# Patient Record
Sex: Female | Born: 1980
Health system: Southern US, Community
[De-identification: ages and names within clinical notes are randomized; demographics above are authoritative.]

## PROBLEM LIST (undated history)

## (undated) DIAGNOSIS — Z8041 Family history of malignant neoplasm of ovary: Secondary | ICD-10-CM

## (undated) DIAGNOSIS — Z23 Encounter for immunization: Secondary | ICD-10-CM

## (undated) DIAGNOSIS — Z1371 Encounter for nonprocreative screening for genetic disease carrier status: Secondary | ICD-10-CM

## (undated) DIAGNOSIS — F32A Depression, unspecified: Secondary | ICD-10-CM

## (undated) DIAGNOSIS — N200 Calculus of kidney: Secondary | ICD-10-CM

## (undated) DIAGNOSIS — F320 Major depressive disorder, single episode, mild: Secondary | ICD-10-CM

## (undated) HISTORY — DX: Encounter for immunization: Z23

## (undated) HISTORY — PX: NO PAST SURGERIES: SHX2092

## (undated) HISTORY — DX: Depression, unspecified: F32.A

## (undated) HISTORY — DX: Family history of malignant neoplasm of ovary: Z80.41

## (undated) HISTORY — DX: Encounter for nonprocreative screening for genetic disease carrier status: Z13.71

## (undated) HISTORY — DX: Calculus of kidney: N20.0

## (undated) HISTORY — DX: Major depressive disorder, single episode, mild: F32.0

---

## 2004-04-01 ENCOUNTER — Emergency Department: Payer: Self-pay | Admitting: Emergency Medicine

## 2007-05-11 ENCOUNTER — Inpatient Hospital Stay: Payer: Self-pay

## 2010-03-17 ENCOUNTER — Observation Stay: Payer: Self-pay

## 2010-03-25 ENCOUNTER — Inpatient Hospital Stay: Payer: Self-pay | Admitting: Obstetrics & Gynecology

## 2015-07-22 DIAGNOSIS — Z803 Family history of malignant neoplasm of breast: Secondary | ICD-10-CM

## 2015-07-22 HISTORY — DX: Family history of malignant neoplasm of breast: Z80.3

## 2015-12-21 ENCOUNTER — Emergency Department (HOSPITAL_COMMUNITY)
Admission: EM | Admit: 2015-12-21 | Discharge: 2015-12-21 | Disposition: A | Discharge status: Home / Self Care | Payer: 59 | Attending: Emergency Medicine | Admitting: Emergency Medicine

## 2015-12-21 ENCOUNTER — Encounter (HOSPITAL_COMMUNITY): Payer: Self-pay

## 2015-12-21 DIAGNOSIS — T23051A Burn of unspecified degree of right palm, initial encounter: Secondary | ICD-10-CM | POA: Insufficient documentation

## 2015-12-21 DIAGNOSIS — Y999 Unspecified external cause status: Secondary | ICD-10-CM | POA: Insufficient documentation

## 2015-12-21 DIAGNOSIS — Y929 Unspecified place or not applicable: Secondary | ICD-10-CM | POA: Insufficient documentation

## 2015-12-21 DIAGNOSIS — T25222A Burn of second degree of left foot, initial encounter: Secondary | ICD-10-CM

## 2015-12-21 DIAGNOSIS — T24202A Burn of second degree of unspecified site of left lower limb, except ankle and foot, initial encounter: Secondary | ICD-10-CM | POA: Diagnosis not present

## 2015-12-21 DIAGNOSIS — Z23 Encounter for immunization: Secondary | ICD-10-CM | POA: Insufficient documentation

## 2015-12-21 DIAGNOSIS — T24001A Burn of unspecified degree of unspecified site of right lower limb, except ankle and foot, initial encounter: Secondary | ICD-10-CM | POA: Diagnosis present

## 2015-12-21 DIAGNOSIS — X088XXA Exposure to other specified smoke, fire and flames, initial encounter: Secondary | ICD-10-CM | POA: Diagnosis not present

## 2015-12-21 DIAGNOSIS — T24201A Burn of second degree of unspecified site of right lower limb, except ankle and foot, initial encounter: Secondary | ICD-10-CM

## 2015-12-21 DIAGNOSIS — Y9339 Activity, other involving climbing, rappelling and jumping off: Secondary | ICD-10-CM | POA: Insufficient documentation

## 2015-12-21 MED ORDER — KETOROLAC TROMETHAMINE 30 MG/ML IJ SOLN
30.0000 mg | Freq: Once | INTRAMUSCULAR | Status: AC
  Administered 2015-12-21: 30 mg via INTRAVENOUS
  Filled 2015-12-21: qty 1

## 2015-12-21 MED ORDER — SODIUM CHLORIDE 0.9 % IV BOLUS (SEPSIS)
1000.0000 mL | Freq: Once | INTRAVENOUS | Status: AC
  Administered 2015-12-21: 1000 mL via INTRAVENOUS

## 2015-12-21 MED ORDER — SILVER SULFADIAZINE 1 % EX CREA
TOPICAL_CREAM | Freq: Once | CUTANEOUS | Status: AC
  Administered 2015-12-21: 1 via TOPICAL
  Filled 2015-12-21: qty 85

## 2015-12-21 MED ORDER — OXYCODONE-ACETAMINOPHEN 5-325 MG PO TABS: 1.0000 | ORAL_TABLET | Freq: Four times a day (QID) | ORAL | 0 refills | Status: DC | PRN

## 2015-12-21 MED ORDER — NAPROXEN 500 MG PO TABS: 500.0000 mg | ORAL_TABLET | Freq: Two times a day (BID) | ORAL | 0 refills | Status: DC

## 2015-12-21 MED ORDER — TETANUS-DIPHTH-ACELL PERTUSSIS 5-2.5-18.5 LF-MCG/0.5 IM SUSP
0.5000 mL | Freq: Once | INTRAMUSCULAR | Status: AC
  Administered 2015-12-21: 0.5 mL via INTRAMUSCULAR
  Filled 2015-12-21: qty 0.5

## 2015-12-21 MED ORDER — HYDROMORPHONE HCL 1 MG/ML IJ SOLN
1.0000 mg | Freq: Once | INTRAMUSCULAR | Status: AC
  Administered 2015-12-21: 1 mg via INTRAVENOUS
  Filled 2015-12-21: qty 1

## 2015-12-21 MED ORDER — MORPHINE SULFATE (PF) 4 MG/ML IV SOLN
4.0000 mg | Freq: Once | INTRAVENOUS | Status: AC
  Administered 2015-12-21: 4 mg via INTRAVENOUS
  Filled 2015-12-21: qty 1
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
## 2015-12-21 NOTE — ED Notes (Signed)
Applied dressing to the legs. Rinsed with sterile water, let dry, applied silver cream and non adherent gauze and wrapped it in curlex.
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
## 2015-12-21 NOTE — Discharge Instructions (Signed)
Apply the silvadene or bacitracin to the wound, change the dressings daily, follow up with a primary care doctor to get the wounds rechecked this week

## 2015-12-21 NOTE — ED Notes (Signed)
Papers reviewed with patient and husband. Dressing change instructions reviewed and pt. Leaving with husband. Both verbalize understanding of follow up care.
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
## 2015-12-30 ENCOUNTER — Encounter: Payer: 59 | Attending: Nurse Practitioner | Admitting: Nurse Practitioner

## 2015-12-30 DIAGNOSIS — Z6841 Body Mass Index (BMI) 40.0 and over, adult: Secondary | ICD-10-CM | POA: Insufficient documentation

## 2015-12-30 DIAGNOSIS — X088XXA Exposure to other specified smoke, fire and flames, initial encounter: Secondary | ICD-10-CM | POA: Diagnosis not present

## 2015-12-30 DIAGNOSIS — T24232A Burn of second degree of left lower leg, initial encounter: Secondary | ICD-10-CM | POA: Insufficient documentation

## 2015-12-30 DIAGNOSIS — T24231A Burn of second degree of right lower leg, initial encounter: Secondary | ICD-10-CM | POA: Diagnosis present

## 2015-12-31 NOTE — Progress Notes (Signed)
Kimberly Hendricks, Kimberly Hendricks ([id number]) Visit Report for 12/30/2015 Allergy List Details Patient Name: Kimberly Hendricks, Kimberly Hendricks. Date of Service: 12/30/2015 3:00 PM Medical Record Number: [id number] Patient Account Number: [id number] Date of Birth/Sex: 1981/03/09 (34 y.o. Female) Treating RN: Huel Coventry Primary Care Physician: Other Clinician: Referring Physician: Linwood Dibbles Treating Physician/Extender: Eugene Garnet in Treatment: 0 Allergies Active Allergies No Known Drug Allergies Allergy Notes Electronic Signature(s) Signed: 12/30/2015 4:56:24 PM By: Elliot Gurney, RN, BSN, Kim RN, BSN Entered By: Elliot Gurney, RN, BSN, Kim on 12/30/2015 15:11:22 Kimberly Hendricks ([id number]) -------------------------------------------------------------------------------- Arrival Information Details Patient Name: Kimberly, Hendricks Date of Service: 12/30/2015 3:00 PM Medical Record Number: [id number] Patient Account Number: [id number] Date of Birth/Sex: September 01, 1980 (34 y.o. Female) Treating RN: Huel Coventry Primary Care Physician: Other Clinician: Referring Physician: Linwood Dibbles Treating Physician/Extender: Eugene Garnet in Treatment: 0 Visit Information Patient Arrived: Ambulatory Arrival Time: 15:08 Accompanied By: self Transfer Assistance: Manual Patient Identification Verified: Yes Secondary Verification Process Yes Completed: Patient Requires Transmission-Based No Precautions: Patient Has Alerts: No Electronic Signature(s) Signed: 12/30/2015 4:56:24 PM By: Elliot Gurney, RN, BSN, Kim RN, BSN Entered By: Elliot Gurney, RN, BSN, Kim on 12/30/2015 15:09:22 Kimberly Hendricks ([id number]) -------------------------------------------------------------------------------- Clinic Level of Care Assessment Details Patient Name: Kimberly, Hendricks. Date of Service: 12/30/2015 3:00 PM Medical Record Number: [id number] Patient Account Number: [id number] Date of Birth/Sex: [birth year] (34 y.o. Female) Treating RN: Huel Coventry Primary  Care Physician: Other Clinician: Referring Physician: Linwood Dibbles Treating Physician/Extender: Eugene Garnet in Treatment: 0 Clinic Level of Care Assessment Items TOOL 2 Quantity Score []  - Use when only an EandM is performed on the INITIAL visit 0 ASSESSMENTS - Nursing Assessment / Reassessment X - General Physical Exam (combine w/ comprehensive assessment (listed just 1 20 below) when performed on new pt. evals) X - Comprehensive Assessment (HX, ROS, Risk Assessments, Wounds Hx, etc.) 1 25 ASSESSMENTS - Wound and Skin Assessment / Reassessment []  - Simple Wound Assessment / Reassessment - one wound 0 X - Complex Wound Assessment / Reassessment - multiple wounds 2 5 []  - Dermatologic / Skin Assessment (not related to wound area) 0 ASSESSMENTS - Ostomy and/or Continence Assessment and Care []  - Incontinence Assessment and Management 0 []  - Ostomy Care Assessment and Management (repouching, etc.) 0 PROCESS - Coordination of Care X - Simple Patient / Family Education for ongoing care 1 15 []  - Complex (extensive) Patient / Family Education for ongoing care 0 X - Staff obtains Chiropractor, Records, Test Results / Process Orders 1 10 []  - Staff telephones HHA, Nursing Homes / Clarify orders / etc 0 []  - Routine Transfer to another Facility (non-emergent condition) 0 []  - Routine Hospital Admission (non-emergent condition) 0 []  - New Admissions / Manufacturing engineer / Ordering NPWT, Apligraf, etc. 0 []  - Emergency Hospital Admission (emergent condition) 0 X - Simple Discharge Coordination 1 10 Kimberly Hendricks, Kimberly N. ([id number]) []  - Complex (extensive) Discharge Coordination 0 PROCESS - Special Needs []  - Pediatric / Minor Patient Management 0 []  - Isolation Patient Management 0 []  - Hearing / Language / Visual special needs 0 []  - Assessment of Community assistance (transportation, D/C planning, etc.) 0 []  - Additional assistance / Altered mentation 0 []  - Support Surface(s)  Assessment (bed, cushion, seat, etc.) 0 INTERVENTIONS - Wound Cleansing / Measurement X - Wound Imaging (photographs - any number of wounds) 1 5 []  - Wound Tracing (instead of photographs) 0 []  - Simple Wound Measurement - one wound 0 X - Complex Wound Measurement - multiple  wounds 2 5 []  - Simple Wound Cleansing - one wound 0 X - Complex Wound Cleansing - multiple wounds 2 5 INTERVENTIONS - Wound Dressings []  - Small Wound Dressing one or multiple wounds 0 []  - Medium Wound Dressing one or multiple wounds 0 X - Large Wound Dressing one or multiple wounds 2 20 []  - Application of Medications - injection 0 INTERVENTIONS - Miscellaneous []  - External ear exam 0 []  - Specimen Collection (cultures, biopsies, blood, body fluids, etc.) 0 []  - Specimen(s) / Culture(s) sent or taken to Lab for analysis 0 []  - Patient Transfer (multiple staff / Nurse, adult / Similar devices) 0 []  - Simple Staple / Suture removal (25 or less) 0 []  - Complex Staple / Suture removal (26 or more) 0 Kimberly Hendricks, Kimberly N. ([id number]) []  - Hypo / Hyperglycemic Management (close monitor of Blood Glucose) 0 []  - Ankle / Brachial Index (ABI) - do not check if billed separately 0 Has the patient been seen at the hospital within the last three years: Yes Total Score: 155 Level Of Care: New/Established - Level 4 Electronic Signature(s) Signed: 12/30/2015 4:56:24 PM By: Elliot Gurney, RN, BSN, Kim RN, BSN Entered By: Elliot Gurney, RN, BSN, Kim on 12/30/2015 15:54:32 Kimberly Hendricks ([id number]) -------------------------------------------------------------------------------- Encounter Discharge Information Details Patient Name: Kimberly, Hendricks Date of Service: 12/30/2015 3:00 PM Medical Record Number: [id number] Patient Account Number: [id number] Date of Birth/Sex: [birth year] (34 y.o. Female) Treating RN: Primary Care Physician: Other Clinician: Referring Physician: Linwood Dibbles Treating Physician/Extender: Eugene Garnet in  Treatment: 0 Encounter Discharge Information Items Discharge Pain Level: 0 Discharge Condition: Stable Ambulatory Status: Ambulatory Discharge Destination: Home Transportation: Other Accompanied By: self Schedule Follow-up Appointment: Yes Medication Reconciliation completed and provided to Patient/Care Yes Verland Sprinkle: Provided on Clinical Summary of Care: 12/30/2015 Form Type Recipient Paper Patient MW Electronic Signature(s) Signed: 12/30/2015 4:56:24 PM By: Elliot Gurney RN, BSN, Kim RN, BSN Previous Signature: 12/30/2015 4:17:52 PM Version By: Gwenlyn Perking Entered By: Elliot Gurney RN, BSN, Kim on 12/30/2015 16:21:27 Kimberly Hendricks ([id number]) -------------------------------------------------------------------------------- Lower Extremity Assessment Details Patient Name: PATRICI, MINNIS Date of Service: 12/30/2015 3:00 PM Medical Record Number: [id number] Patient Account Number: [id number] Date of Birth/Sex: 1980-06-28 (34 y.o. Female) Treating RN: Huel Coventry Primary Care Physician: Other Clinician: Referring Physician: Linwood Dibbles Treating Physician/Extender: Eugene Garnet in Treatment: 0 Vascular Assessment Pulses: Posterior Tibial Palpable: [Left:Yes] [Right:Yes] Doppler: [Left:Multiphasic] [Right:Multiphasic] Dorsalis Pedis Palpable: [Left:Yes] [Right:Yes] Doppler: [Left:Multiphasic] [Right:Multiphasic] Extremity colors, hair growth, and conditions: Extremity Color: [Left:Red] [Right:Red] Hair Growth on Extremity: [Left:Yes] [Right:Yes] Temperature of Extremity: [Left:Warm] [Right:Warm] Capillary Refill: [Left:< 3 seconds] [Right:< 3 seconds] Dependent Rubor: [Left:No] [Right:No] Blanched when Elevated: [Left:No] [Right:No] Lipodermatosclerosis: [Right:No] Toe Nail Assessment Left: Right: Thick: No No Discolored: No No Deformed: No No Improper Length and Hygiene: No No Electronic Signature(s) Signed: 12/30/2015 4:56:24 PM By: Elliot Gurney, RN, BSN, Kim RN,  BSN Entered By: Elliot Gurney, RN, BSN, Kim on 12/30/2015 15:46:22 Kimberly Hendricks ([id number]) -------------------------------------------------------------------------------- Multi Wound Chart Details Patient Name: Kimberly Hendricks Date of Service: 12/30/2015 3:00 PM Medical Record Number: [id number] Patient Account Number: [id number] Date of Birth/Sex: 1981/03/16 (34 y.o. Female) Treating RN: Huel Coventry Primary Care Physician: Other Clinician: Referring Physician: Linwood Dibbles Treating Physician/Extender: Eugene Garnet in Treatment: 0 Vital Signs Height(in): 68 Pulse(bpm): 86 Weight(lbs): 298 Blood Pressure 132/76 (mmHg): Body Mass Index(BMI): 45 Temperature(F): 98.4 Respiratory Rate 16 (breaths/min): Photos: [N/A:N/A] Wound Location: Right Lower Leg Left Lower Leg N/A Wounding Event: Thermal Burn Thermal Burn N/A Primary Etiology:  2nd degree Burn 2nd degree Burn N/A Date Acquired: 12/21/2015 12/21/2015 N/A Weeks of Treatment: 0 0 N/A Wound Status: Open Open N/A Clustered Wound: Yes Yes N/A Measurements L x W x D 25x27x0.1 12.5x15x0.1 N/A (cm) Area (cm) : 530.144 147.262 N/A Volume (cm) : 53.014 14.726 N/A Classification: Full Thickness Without Full Thickness Without N/A Exposed Support Exposed Support Structures Structures Exudate Amount: Small Small N/A Exudate Type: Serous Serous N/A Exudate Color: amber amber N/A Wound Margin: Flat and Intact Flat and Intact N/A Granulation Amount: Small (1-33%) Medium (34-66%) N/A Kimberly Hendricks, Kimberly N. ([id number]) Granulation Quality: Red, Pink Red, Pink N/A Necrotic Amount: Medium (34-66%) Small (1-33%) N/A Exposed Structures: Fascia: No Fascia: No N/A Fat: No Fat: No Tendon: No Tendon: No Muscle: No Muscle: No Joint: No Joint: No Bone: No Bone: No Limited to Skin Limited to Skin Breakdown Breakdown Epithelialization: None None N/A Periwound Skin Texture: Scarring: Yes Scarring: Yes N/A Edema: No Edema:  No Excoriation: No Excoriation: No Induration: No Induration: No Callus: No Callus: No Crepitus: No Crepitus: No Fluctuance: No Fluctuance: No Friable: No Friable: No Rash: No Rash: No Periwound Skin Moist: Yes Maceration: No N/A Moisture: Maceration: No Moist: No Dry/Scaly: No Dry/Scaly: No Periwound Skin Color: Atrophie Blanche: No Atrophie Blanche: No N/A Cyanosis: No Cyanosis: No Ecchymosis: No Ecchymosis: No Erythema: No Erythema: No Hemosiderin Staining: No Hemosiderin Staining: No Mottled: No Mottled: No Pallor: No Pallor: No Rubor: No Rubor: No Tenderness on No Yes N/A Palpation: Wound Preparation: Ulcer Cleansing: Other: Ulcer Cleansing: Other: N/A cold water cold water Topical Anesthetic Topical Anesthetic Applied: None Applied: None Treatment Notes Electronic Signature(s) Signed: 12/30/2015 4:56:24 PM By: Elliot Gurney, RN, BSN, Kim RN, BSN Entered By: Elliot Gurney, RN, BSN, Kim on 12/30/2015 15:48:07 Kimberly Hendricks ([id number]) -------------------------------------------------------------------------------- Multi-Disciplinary Care Plan Details Patient Name: HAIZEL, GATCHELL Date of Service: 12/30/2015 3:00 PM Medical Record Number: [id number] Patient Account Number: [id number] Date of Birth/Sex: 07-27-1980 (34 y.o. Female) Treating RN: Huel Coventry Primary Care Physician: Other Clinician: Referring Physician: Linwood Dibbles Treating Physician/Extender: Eugene Garnet in Treatment: 0 Active Inactive Abuse / Safety / Falls / Self Care Management Nursing Diagnoses: Potential for falls Goals: Patient will remain injury free Date Initiated: 12/30/2015 Goal Status: Active Interventions: Assess fall risk on admission and as needed Assess self care needs on admission and as needed Notes: Nutrition Nursing Diagnoses: Imbalanced nutrition Goals: Patient/caregiver agrees to and verbalizes understanding of need to obtain nutritional consultation Date  Initiated: 12/30/2015 Goal Status: Active Interventions: Assess patient nutrition upon admission and as needed per policy Notes: Orientation to the Wound Care Program Nursing Diagnoses: Knowledge deficit related to the wound healing center program Goals: Patient/caregiver will verbalize understanding of the [facility] [address] Kimberly Hendricks, Kimberly Hendricks ([id number]) Date Initiated: 12/30/2015 Goal Status: Active Interventions: Provide education on orientation to the wound center Notes: Wound/Skin Impairment Nursing Diagnoses: Impaired tissue integrity Goals: Patient will have a decrease in wound volume by X% from date: (specify in notes) Date Initiated: 12/30/2015 Goal Status: Active Ulcer/skin breakdown will have a volume reduction of 30% by week 4 Date Initiated: 12/30/2015 Goal Status: Active Interventions: Assess ulceration(s) every visit Notes: Electronic Signature(s) Signed: 12/30/2015 4:56:24 PM By: Elliot Gurney, RN, BSN, Kim RN, BSN Entered By: Elliot Gurney, RN, BSN, Kim on 12/30/2015 15:47:31 Kimberly Hendricks ([id number]) -------------------------------------------------------------------------------- Pain Assessment Details Patient Name: Kimberly Hendricks Date of Service: 12/30/2015 3:00 PM Medical Record Number: [id number] Patient Account Number: [id number] Date of Birth/Sex: 1980-07-04 (34 y.o. Female) Treating RN: Elliot Gurney,  Kim Primary Care Physician: Other Clinician: Referring Physician: Linwood DibblesKNAPP, JON Treating Physician/Extender: Eugene GarnetSaunders, Sharon Weeks in Treatment: 0 Active Problems Location of Pain Severity and Description of Pain Patient Has Paino Yes Site Locations Pain Location: Pain in Ulcers With Dressing Change: Yes Duration of the Pain. Constant / Intermittento Constant Rate the pain. Current Pain Level: 7 Worst Pain Level: 10 Least Pain Level: 5 Character of Pain Describe the Pain: Burning, Sharp, Shooting, Tender, Throbbing Pain Management and  Medication Current Pain Management: Medication: No Cold Application: No Rest: No Massage: No Activity: No T.E.HendricksS.: No Heat Application: No Leg drop or elevation: No Is the Current Pain Management Inadequate Adequate: How does your pain impact your activities of daily livingo Sleep: No Bathing: No Appetite: No Relationship With Others: No Bladder Continence: No Emotions: No Bowel Continence: No Work: No Toileting: No Drive: No Dressing: No Hobbies: No Goals for Pain Management Topical or injectable lidocaine is offered to patient for acute pain when surgical debridement is performed. If needed, Patient is instructed to use over the counter pain medication for the following 24-48 hours after Vanna ScotlandWALKER, Kimberly N. ([id number]) debridement. Wound care MDs do not prescribed pain medications. Patient has chronic pain or uncontrolled pain. Patient has been instructed to make an appointment with their Primary Care Physician for pain management. Electronic Signature(s) Signed: 12/30/2015 4:56:24 PM By: Elliot GurneyWoody, RN, BSN, Kim RN, BSN Entered By: Elliot GurneyWoody, RN, BSN, Kim on 12/30/2015 15:10:22 Kimberly BinetWALKER, Kimberly N. ([id number]) -------------------------------------------------------------------------------- Patient/Caregiver Education Details Patient Name: Kimberly BinetWALKER, Kimberly N. Date of Service: 12/30/2015 3:00 PM Medical Record Number: [id number] Patient Account Number: [id number] Date of Birth/Gender: [birth year] (34 y.o. Female) Treating RN: Huel CoventryWoody, Kim Primary Care Physician: Other Clinician: Referring Physician: Linwood DibblesKNAPP, JON Treating Physician/Extender: Eugene GarnetSaunders, Sharon Weeks in Treatment: 0 Education Assessment Education Provided To: Patient Education Topics Provided Wound/Skin Impairment: Handouts: Caring for Your Ulcer, Other: wound care as prescribed Methods: Demonstration, Explain/Verbal Responses: State content correctly Electronic Signature(s) Signed: 12/30/2015 4:56:24 PM By: Elliot GurneyWoody, RN,  BSN, Kim RN, BSN Entered By: Elliot GurneyWoody, RN, BSN, Kim on 12/30/2015 16:21:52 Kimberly BinetWALKER, Kimberly N. ([id number]) -------------------------------------------------------------------------------- Wound Assessment Details Patient Name: Kimberly BinetWALKER, Kimberly N. Date of Service: 12/30/2015 3:00 PM Medical Record Number: [id number] Patient Account Number: [id number] Date of Birth/Sex: [birth year] (34 y.o. Female) Treating RN: Huel CoventryWoody, Kim Primary Care Physician: Other Clinician: Referring Physician: Linwood DibblesKNAPP, JON Treating Physician/Extender: Eugene GarnetSaunders, Sharon Weeks in Treatment: 0 Wound Status Wound Number: 1 Primary Etiology: 2nd degree Burn Wound Location: Right Lower Leg Wound Status: Open Wounding Event: Thermal Burn Date Acquired: 12/21/2015 Weeks Of Treatment: 0 Clustered Wound: Yes Photos Wound Measurements Length: (cm) 25 Width: (cm) 27 Depth: (cm) 0.1 Area: (cm) 530.144 Volume: (cm) 53.014 % Reduction in Area: 0% % Reduction in Volume: 0% Epithelialization: None Tunneling: No Undermining: No Wound Description Full Thickness Without Exposed Classification: Support Structures Wound Margin: Flat and Intact Exudate Large Amount: Exudate Type: Serous Exudate Color: amber Wound Bed Granulation Amount: Small (1-33%) Exposed Structure Granulation Quality: Red, Pink Fascia Exposed: No Necrotic Amount: Medium (34-66%) Fat Layer Exposed: No Necrotic Quality: Adherent Slough Tendon Exposed: No Muscle Exposed: No Joint Exposed: No Clare, Altheria N. ([id number]) Bone Exposed: No Limited to Skin Breakdown Periwound Skin Texture Texture Color No Abnormalities Noted: No No Abnormalities Noted: No Callus: No Atrophie Blanche: No Crepitus: No Cyanosis: No Excoriation: No Ecchymosis: No Fluctuance: No Erythema: No Friable: No Hemosiderin Staining: No Induration: No Mottled: No Localized Edema: No Pallor: No Rash: No Rubor: No Scarring: Yes Moisture No Abnormalities Noted:  No  Dry / Scaly: No Maceration: No Moist: Yes Wound Preparation Ulcer Cleansing: Other: cold water, Topical Anesthetic Applied: None Treatment Notes Wound #1 (Right Lower Leg) 1. Cleansed with: May Shower, gently pat wound dry prior to applying new dressing. 4. Dressing Applied: Silvadene Cream 5. Secondary Dressing Applied Petroleum gauze Notes Telfa, ABD, kerlix and Coban Electronic Signature(s) Signed: 12/30/2015 4:56:24 PM By: Elliot Gurney, RN, BSN, Kim RN, BSN Entered By: Elliot Gurney, RN, BSN, Kim on 12/30/2015 16:34:14 Kimberly Hendricks ([id number]) -------------------------------------------------------------------------------- Wound Assessment Details Patient Name: Kimberly Hendricks, Kimberly Hendricks Date of Service: 12/30/2015 3:00 PM Medical Record Number: [id number] Patient Account Number: [id number] Date of Birth/Sex: 1980/09/07 (34 y.o. Female) Treating RN: Huel Coventry Primary Care Physician: Other Clinician: Referring Physician: Linwood Dibbles Treating Physician/Extender: Eugene Garnet in Treatment: 0 Wound Status Wound Number: 2 Primary Etiology: 2nd degree Burn Wound Location: Left Lower Leg Wound Status: Open Wounding Event: Thermal Burn Date Acquired: 12/21/2015 Weeks Of Treatment: 0 Clustered Wound: Yes Photos Wound Measurements Length: (cm) 12.5 Width: (cm) 15 Depth: (cm) 0.1 Area: (cm) 147.262 Volume: (cm) 14.726 % Reduction in Area: 0% % Reduction in Volume: 0% Epithelialization: None Tunneling: No Undermining: No Wound Description Full Thickness Without Exposed Classification: Support Structures Wound Margin: Flat and Intact Exudate Large Amount: Exudate Type: Serous Exudate Color: amber Wound Bed Granulation Amount: Medium (34-66%) Exposed Structure Granulation Quality: Red, Pink Fascia Exposed: No Necrotic Amount: Small (1-33%) Fat Layer Exposed: No Necrotic Quality: Adherent Slough Tendon Exposed: No Muscle Exposed: No Joint Exposed: No Kostick,  Jorie N. ([id number]) Bone Exposed: No Limited to Skin Breakdown Periwound Skin Texture Texture Color No Abnormalities Noted: No No Abnormalities Noted: No Callus: No Atrophie Blanche: No Crepitus: No Cyanosis: No Excoriation: No Ecchymosis: No Fluctuance: No Erythema: No Friable: No Hemosiderin Staining: No Induration: No Mottled: No Localized Edema: No Pallor: No Rash: No Rubor: No Scarring: Yes Temperature / Pain Moisture Tenderness on Palpation: Yes No Abnormalities Noted: No Dry / Scaly: No Maceration: No Moist: No Wound Preparation Ulcer Cleansing: Other: cold water, Topical Anesthetic Applied: None Treatment Notes Wound #2 (Left Lower Leg) 1. Cleansed with: May Shower, gently pat wound dry prior to applying new dressing. 4. Dressing Applied: Silvadene Cream 5. Secondary Dressing Applied Petroleum gauze Notes Telfa, ABD, kerlix and Coban Electronic Signature(s) Signed: 12/30/2015 4:56:24 PM By: Elliot Gurney, RN, BSN, Kim RN, BSN Entered By: Elliot Gurney, RN, BSN, Kim on 12/30/2015 16:34:27 Kimberly Hendricks ([id number]) -------------------------------------------------------------------------------- Vitals Details Patient Name: Kimberly Hendricks Date of Service: 12/30/2015 3:00 PM Medical Record Number: [id number] Patient Account Number: [id number] Date of Birth/Sex: December 01, 1980 (34 y.o. Female) Treating RN: Huel Coventry Primary Care Physician: Other Clinician: Referring Physician: Linwood Dibbles Treating Physician/Extender: Eugene Garnet in Treatment: 0 Vital Signs Time Taken: 13:10 Temperature (F): 98.4 Height (in): 68 Pulse (bpm): 86 Source: Stated Respiratory Rate (breaths/min): 16 Weight (lbs): 298 Blood Pressure (mmHg): 132/76 Source: Stated Reference Range: 80 - 120 mg / dl Body Mass Index (BMI): 45.3 Electronic Signature(s) Signed: 12/30/2015 4:56:24 PM By: Elliot Gurney, RN, BSN, Kim RN, BSN Entered By: Elliot Gurney, RN, BSN, Kim on 12/30/2015 15:11:05

## 2015-12-31 NOTE — Progress Notes (Signed)
Kimberly Hendricks, Kimberly N. ([id number]) Visit Report for 12/30/2015 Abuse/Suicide Risk Screen Details Patient Name: Kimberly Hendricks, Kimberly N. 12/30/2015 3:00 Date of Service: PM Medical Record [id number] Number: Patient Account Number: [id number] 1980/06/01 (34 y.o. Treating RN: Huel CoventryWoody, Kim Date of Birth/Sex: Female) Other Clinician: Primary Care Physician: Treating Georges LynchSaunders, Sharon Referring Physician: Linwood DibblesKNAPP, JON Physician/Extender: Tania AdeWeeks in Treatment: 0 Abuse/Suicide Risk Screen Items Answer ABUSE/SUICIDE RISK SCREEN: Has anyone close to you tried to hurt or harm you recentlyo No Do you feel uncomfortable with anyone in your familyo No Has anyone forced you do things that you didnot want to doo No Do you have any thoughts of harming yourselfo No Patient displays signs or symptoms of abuse and/or neglect. No Electronic Signature(s) Signed: 12/30/2015 4:56:24 PM By: Elliot GurneyWoody, RN, BSN, Kim RN, BSN Entered By: Elliot GurneyWoody, RN, BSN, Kim on 12/30/2015 15:16:55 Kimberly Hendricks, Kimberly N. ([id number]) -------------------------------------------------------------------------------- Activities of Daily Living Details Patient Name: Kimberly Hendricks, Kimberly N. 12/30/2015 3:00 Date of Service: PM Medical Record [id number] Number: Patient Account Number: [id number] 1980/06/01 (34 y.o. Treating RN: Huel CoventryWoody, Kim Date of Birth/Sex: Female) Other Clinician: Primary Care Physician: Treating Georges LynchSaunders, Sharon Referring Physician: Linwood DibblesKNAPP, JON Physician/Extender: Tania AdeWeeks in Treatment: 0 Activities of Daily Living Items Answer Activities of Daily Living (Please select one for each item) Drive Automobile Completely Able Take Medications Completely Able Use Telephone Completely Able Care for Appearance Completely Able Use Toilet Completely Able Bath / Shower Completely Able Dress Self Completely Able Feed Self Completely Able Walk Completely Able Get In / Out Bed Completely Able Housework Completely Able Prepare Meals Completely  Able Handle Money Completely Able Shop for Self Completely Able Electronic Signature(s) Signed: 12/30/2015 4:56:24 PM By: Elliot GurneyWoody, RN, BSN, Kim RN, BSN Entered By: Elliot GurneyWoody, RN, BSN, Kim on 12/30/2015 15:17:07 Kimberly Hendricks, Kimberly N. ([id number]) -------------------------------------------------------------------------------- Education Assessment Details Patient Name: Kimberly Hendricks, Kimberly N. 12/30/2015 3:00 Date of Service: PM Medical Record [id number] Number: Patient Account Number: [id number] 1980/06/01 (34 y.o. Treating RN: Huel CoventryWoody, Kim Date of Birth/Sex: Female) Other Clinician: Primary Care Physician: Treating Georges LynchSaunders, Sharon Referring Physician: Linwood DibblesKNAPP, JON Physician/Extender: Tania AdeWeeks in Treatment: 0 Primary Learner Assessed: Patient Learning Preferences/Education Level/Primary Language Learning Preference: Explanation, Demonstration Highest Education Level: College or Above Preferred Language: English Cognitive Barrier Assessment/Beliefs Language Barrier: No Translator Needed: No Memory Deficit: No Emotional Barrier: No Cultural/Religious Beliefs Affecting Medical No Care: Physical Barrier Assessment Impaired Vision: Yes Glasses Impaired Hearing: No Decreased Hand dexterity: No Knowledge/Comprehension Assessment Knowledge Level: High Comprehension Level: High Ability to understand written High instructions: Ability to understand verbal High instructions: Motivation Assessment Anxiety Level: Calm Cooperation: Cooperative Education Importance: Acknowledges Need Interest in Health Problems: Asks Questions Perception: Coherent Willingness to Engage in Self- High Management Activities: Readiness to Engage in Self- High Management Activities: Kimberly Hendricks, Kimberly N. ([id number]) Electronic Signature(s) Signed: 12/30/2015 4:56:24 PM By: Elliot GurneyWoody, RN, BSN, Kim RN, BSN Entered By: Elliot GurneyWoody, RN, BSN, Kim on 12/30/2015 15:17:36 Kimberly Hendricks, Kimberly N.  ([id number]) -------------------------------------------------------------------------------- Fall Risk Assessment Details Patient Name: Kimberly Hendricks, Kimberly N. 12/30/2015 3:00 Date of Service: PM Medical Record [id number] Number: Patient Account Number: [id number] 1980/06/01 (34 y.o. Treating RN: Huel CoventryWoody, Kim Date of Birth/Sex: Female) Other Clinician: Primary Care Physician: Treating Georges LynchSaunders, Sharon Referring Physician: Linwood DibblesKNAPP, JON Physician/Extender: Tania AdeWeeks in Treatment: 0 Fall Risk Assessment Items Have you had 2 or more falls in the last 12 monthso 0 No Have you had any fall that resulted in injury in the last 12 monthso 0 Yes FALL RISK ASSESSMENT: History of falling - immediate or within 3 months 25 Yes Secondary diagnosis 0  No Ambulatory aid None/bed rest/wheelchair/nurse 0 Yes Crutches/cane/Belter 0 No Furniture 0 No IV Access/Saline Lock 0 No Gait/Training Normal/bed rest/immobile 0 Yes Weak 0 No Impaired 0 No Mental Status Oriented to own ability 0 Yes Electronic Signature(s) Signed: 12/30/2015 4:56:24 PM By: Elliot Gurney, RN, BSN, Kim RN, BSN Entered By: Elliot Gurney, RN, BSN, Kim on 12/30/2015 15:18:17 Kimberly Hendricks ([id number]) -------------------------------------------------------------------------------- Nutrition Risk Assessment Details Patient Name: Kimberly Hendricks 12/30/2015 3:00 Date of Service: PM Medical Record [id number] Number: Patient Account Number: [id number] 06-19-1980 (34 y.o. Treating RN: Huel Coventry Date of Birth/Sex: Female) Other Clinician: Primary Care Physician: Treating Georges Lynch Referring Physician: Linwood Dibbles Physician/Extender: Tania Ade in Treatment: 0 Height (in): 68 Weight (lbs): 298 Body Mass Index (BMI): 45.3 Nutrition Risk Assessment Items NUTRITION RISK SCREEN: I have an illness or condition that made me change the kind and/or 0 No amount of food I eat I eat fewer than two meals per day 0 No I eat few fruits and vegetables, or  milk products 2 Yes I have three or more drinks of beer, liquor or wine almost every day 0 No I have tooth or mouth problems that make it hard for me to eat 0 No I don't always have enough money to buy the food I need 0 No I eat alone most of the time 0 No I take three or more different prescribed or over-the-counter drugs a 0 No day Without wanting to, I have lost or gained 10 pounds in the last six 0 No months I am not always physically able to shop, cook and/or feed myself 0 No Nutrition Protocols Good Risk Protocol 0 No interventions needed Moderate Risk Protocol Electronic Signature(s) Signed: 12/30/2015 4:56:24 PM By: Elliot Gurney, RN, BSN, Kim RN, BSN Entered By: Elliot Gurney, RN, BSN, Kim on 12/30/2015 15:18:38
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
## 2016-01-06 ENCOUNTER — Encounter (HOSPITAL_BASED_OUTPATIENT_CLINIC_OR_DEPARTMENT_OTHER): Payer: 59 | Admitting: General Surgery

## 2016-01-06 DIAGNOSIS — T24231S Burn of second degree of right lower leg, sequela: Secondary | ICD-10-CM

## 2016-01-06 DIAGNOSIS — T24232S Burn of second degree of left lower leg, sequela: Secondary | ICD-10-CM | POA: Diagnosis not present

## 2016-01-06 DIAGNOSIS — T24231A Burn of second degree of right lower leg, initial encounter: Secondary | ICD-10-CM | POA: Diagnosis not present

## 2016-01-06 NOTE — Progress Notes (Signed)
See I heal 
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
## 2016-01-06 NOTE — Progress Notes (Signed)
Kimberly Hendricks, Kimberly Hendricks ([id number]) Visit Report for 01/06/2016 Arrival Information Details Patient Name: Kimberly Hendricks, Kimberly Hendricks. Date of Service: 01/06/2016 2:15 PM Medical Record Number: [id number] Patient Account Number: [id number] Date of Birth/Sex: 29-Jun-1980 (34 y.o. Female) Treating RN: Ashok Cordia, Debi Primary Care Physician: Other Clinician: Referring Physician: Linwood Dibbles Treating Physician/Extender: Elayne Snare in Treatment: 1 Visit Information History Since Last Visit All ordered tests and consults were completed: No Patient Arrived: Ambulatory Added or deleted any medications: No Arrival Time: 14:04 Any new allergies or adverse reactions: No Accompanied By: self Had a fall or experienced change in No Transfer Assistance: None activities of daily living that may affect Patient Identification Verified: Yes risk of falls: Secondary Verification Process Yes Signs or symptoms of abuse/neglect since last No Completed: visito Patient Requires Transmission-Based No Hospitalized since last visit: No Precautions: Pain Present Now: Yes Patient Has Alerts: No Electronic Signature(s) Signed: 01/06/2016 4:03:46 PM By: Alejandro Mulling Entered By: Alejandro Mulling on 01/06/2016 14:04:43 Kimberly Hendricks ([id number]) -------------------------------------------------------------------------------- Clinic Level of Care Assessment Details Patient Name: Kimberly Hendricks Date of Service: 01/06/2016 2:15 PM Medical Record Number: [id number] Patient Account Number: [id number] Date of Birth/Sex: Feb 10, 1981 (34 y.o. Female) Treating RN: Huel Coventry Primary Care Physician: Other Clinician: Referring Physician: Linwood Dibbles Treating Physician/Extender: Elayne Snare in Treatment: 1 Clinic Level of Care Assessment Items TOOL 4 Quantity Score []  - Use when only an EandM is performed on FOLLOW-UP visit 0 ASSESSMENTS - Nursing Assessment / Reassessment []  - Reassessment of  Co-morbidities (includes updates in patient status) 0 X - Reassessment of Adherence to Treatment Plan 1 5 ASSESSMENTS - Wound and Skin Assessment / Reassessment []  - Simple Wound Assessment / Reassessment - one wound 0 X - Complex Wound Assessment / Reassessment - multiple wounds 2 5 []  - Dermatologic / Skin Assessment (not related to wound area) 0 ASSESSMENTS - Focused Assessment []  - Circumferential Edema Measurements - multi extremities 0 []  - Nutritional Assessment / Counseling / Intervention 0 []  - Lower Extremity Assessment (monofilament, tuning fork, pulses) 0 []  - Peripheral Arterial Disease Assessment (using hand held doppler) 0 ASSESSMENTS - Ostomy and/or Continence Assessment and Care []  - Incontinence Assessment and Management 0 []  - Ostomy Care Assessment and Management (repouching, etc.) 0 PROCESS - Coordination of Care X - Simple Patient / Family Education for ongoing care 1 15 []  - Complex (extensive) Patient / Family Education for ongoing care 0 []  - Staff obtains Chiropractor, Records, Test Results / Process Orders 0 []  - Staff telephones HHA, Nursing Homes / Clarify orders / etc 0 []  - Routine Transfer to another Facility (non-emergent condition) 0 Chapel, Royanne N. ([id number]) []  - Routine Hospital Admission (non-emergent condition) 0 []  - New Admissions / Manufacturing engineer / Ordering NPWT, Apligraf, etc. 0 []  - Emergency Hospital Admission (emergent condition) 0 X - Simple Discharge Coordination 1 10 []  - Complex (extensive) Discharge Coordination 0 PROCESS - Special Needs []  - Pediatric / Minor Patient Management 0 []  - Isolation Patient Management 0 []  - Hearing / Language / Visual special needs 0 []  - Assessment of Community assistance (transportation, D/C planning, etc.) 0 []  - Additional assistance / Altered mentation 0 []  - Support Surface(s) Assessment (bed, cushion, seat, etc.) 0 INTERVENTIONS - Wound Cleansing / Measurement []  - Simple Wound  Cleansing - one wound 0 X - Complex Wound Cleansing - multiple wounds 2 5 X - Wound Imaging (photographs - any number of wounds) 1 5 []  - Wound Tracing (instead of photographs)  0 []  - Simple Wound Measurement - one wound 0 X - Complex Wound Measurement - multiple wounds 2 5 INTERVENTIONS - Wound Dressings []  - Small Wound Dressing one or multiple wounds 0 []  - Medium Wound Dressing one or multiple wounds 0 X - Large Wound Dressing one or multiple wounds 2 20 X - Application of Medications - topical 1 5 []  - Application of Medications - injection 0 INTERVENTIONS - Miscellaneous []  - External ear exam 0 Kimberly Hendricks, Kimberly N. ([id number]) []  - Specimen Collection (cultures, biopsies, blood, body fluids, etc.) 0 []  - Specimen(s) / Culture(s) sent or taken to Lab for analysis 0 []  - Patient Transfer (multiple staff / Nurse, adult / Similar devices) 0 []  - Simple Staple / Suture removal (25 or less) 0 []  - Complex Staple / Suture removal (26 or more) 0 []  - Hypo / Hyperglycemic Management (close monitor of Blood Glucose) 0 []  - Ankle / Brachial Index (ABI) - do not check if billed separately 0 X - Vital Signs 1 5 Has the patient been seen at the hospital within the last three years: Yes Total Score: 115 Level Of Care: New/Established - Level 3 Electronic Signature(s) Unsigned Entered By: Elliot Gurney, RN, BSN, Kim on 01/06/2016 16:43:58 Signature(s): Date(s): Kimberly Hendricks ([id number]) -------------------------------------------------------------------------------- Encounter Discharge Information Details Patient Name: Kimberly Hendricks, Kimberly Hendricks. Date of Service: 01/06/2016 2:15 PM Medical Record Number: [id number] Patient Account Number: [id number] Date of Birth/Sex: June 29, 1980 (34 y.o. Female) Treating RN: Ashok Cordia, Debi Primary Care Physician: Other Clinician: Referring Physician: Linwood Dibbles Treating Physician/Extender: Elayne Snare in Treatment: 1 Encounter Discharge Information  Items Discharge Pain Level: 0 Discharge Condition: Stable Ambulatory Status: Ambulatory Discharge Destination: Home Transportation: Private Auto Accompanied By: self Schedule Follow-up Appointment: Yes Medication Reconciliation completed and provided to Patient/Care Yes Sayyid Harewood: Provided on Clinical Summary of Care: 01/06/2016 Form Type Recipient Paper Patient MW Electronic Signature(s) Signed: 01/06/2016 4:03:46 PM By: Alejandro Mulling Previous Signature: 01/06/2016 2:57:24 PM Version By: Gwenlyn Perking Previous Signature: 01/06/2016 2:48:17 PM Version By: Ardath Sax MD Entered By: Alejandro Mulling on 01/06/2016 15:00:20 Kimberly Hendricks ([id number]) -------------------------------------------------------------------------------- Lower Extremity Assessment Details Patient Name: Kimberly Hendricks Date of Service: 01/06/2016 2:15 PM Medical Record Number: [id number] Patient Account Number: [id number] Date of Birth/Sex: 1980/09/29 (34 y.o. Female) Treating RN: Ashok Cordia, Debi Primary Care Physician: Other Clinician: Referring Physician: Linwood Dibbles Treating Physician/Extender: Elayne Snare in Treatment: 1 Vascular Assessment Pulses: Posterior Tibial Dorsalis Pedis Palpable: [Left:Yes] [Right:Yes] Extremity colors, hair growth, and conditions: Extremity Color: [Left:Normal] [Right:Normal] Temperature of Extremity: [Left:Warm] [Right:Warm] Capillary Refill: [Left:< 3 seconds] Toe Nail Assessment Left: Right: Thick: No No Discolored: No No Deformed: No No Improper Length and Hygiene: No No Electronic Signature(s) Signed: 01/06/2016 4:03:46 PM By: Alejandro Mulling Entered By: Alejandro Mulling on 01/06/2016 14:15:56 Palmero, Elnita Maxwell ([id number]) -------------------------------------------------------------------------------- Multi Wound Chart Details Patient Name: Kimberly Hendricks Date of Service: 01/06/2016 2:15 PM Medical Record Number: [id number] Patient  Account Number: [id number] Date of Birth/Sex: [birth year] (34 y.o. Female) Treating RN: Ashok Cordia, Debi Primary Care Physician: Other Clinician: Referring Physician: Linwood Dibbles Treating Physician/Extender: Elayne Snare in Treatment: 1 Vital Signs Height(in): 68 Pulse(bpm): 61 Weight(lbs): 298 Blood Pressure 136/75 (mmHg): Body Mass Index(BMI): 45 Temperature(F): 97.5 Respiratory Rate 18 (breaths/min): Photos: [1:No Photos] [2:No Photos] [N/A:N/A] Wound Location: [1:Right Lower Leg] [2:Left Lower Leg] [N/A:N/A] Wounding Event: [1:Thermal Burn] [2:Thermal Burn] [N/A:N/A] Primary Etiology: [1:2nd degree Burn] [2:2nd degree Burn] [N/A:N/A] Date Acquired: [1:12/21/2015] [2:12/21/2015] [N/A:N/A] Weeks of Treatment: [1:1] [2:1] [N/A:N/A] Wound  Status: [1:Open] [2:Open] [N/A:N/A] Clustered Wound: [1:Yes] [2:Yes] [N/A:N/A] Measurements L x W x D 19x26x0.2 [2:12x15x0.1] [N/A:N/A] (cm) Area (cm) : [1:387.987] [2:141.372] [N/A:N/A] Volume (cm) : [1:77.597] [2:14.137] [N/A:N/A] % Reduction in Area: [1:26.80%] [2:4.00%] [N/A:N/A] % Reduction in Volume: -46.40% [2:4.00%] [N/A:N/A] Classification: [1:Full Thickness Without Exposed Support Structures] [2:Full Thickness Without Exposed Support Structures] [N/A:N/A] Exudate Amount: [1:Large] [2:Large] [N/A:N/A] Exudate Type: [1:Serosanguineous] [2:Serosanguineous] [N/A:N/A] Exudate Color: [1:red, brown] [2:red, brown] [N/A:N/A] Wound Margin: [1:Flat and Intact] [2:Flat and Intact] [N/A:N/A] Granulation Amount: [1:Medium (34-66%)] [2:Medium (34-66%)] [N/A:N/A] Granulation Quality: [1:Red, Kimberly Hendricks] [2:Red, Kimberly Hendricks] [N/A:N/A] Necrotic Amount: [1:Medium (34-66%)] [2:Medium (34-66%)] [N/A:N/A] Exposed Structures: [1:Fascia: No Fat: No Tendon: No Muscle: No Joint: No Bone: No] [2:Fascia: No Fat: No Tendon: No Muscle: No Joint: No Bone: No] [N/A:N/A] Limited to Skin Limited to Skin Breakdown Breakdown Epithelialization: None None N/A Periwound  Skin Texture: Scarring: Yes Scarring: Yes N/A Edema: No Edema: No Excoriation: No Excoriation: No Induration: No Induration: No Callus: No Callus: No Crepitus: No Crepitus: No Fluctuance: No Fluctuance: No Friable: No Friable: No Rash: No Rash: No Periwound Skin Moist: Yes Maceration: No N/A Moisture: Maceration: No Moist: No Dry/Scaly: No Dry/Scaly: No Periwound Skin Color: Atrophie Blanche: No Atrophie Blanche: No N/A Cyanosis: No Cyanosis: No Ecchymosis: No Ecchymosis: No Erythema: No Erythema: No Hemosiderin Staining: No Hemosiderin Staining: No Mottled: No Mottled: No Pallor: No Pallor: No Rubor: No Rubor: No Tenderness on No Yes N/A Palpation: Wound Preparation: Ulcer Cleansing: Other: Ulcer Cleansing: Other: N/A cold water cold water Topical Anesthetic Topical Anesthetic Applied: None Applied: None Treatment Notes Electronic Signature(s) Signed: 01/06/2016 4:03:46 PM By: Alejandro MullingPinkerton, Debra Entered By: Alejandro MullingPinkerton, Debra on 01/06/2016 14:29:35 Kimberly Hendricks, Kimberly N. ([id number]) -------------------------------------------------------------------------------- Multi-Disciplinary Care Plan Details Patient Name: Kimberly Hendricks, Kimberly N. Date of Service: 01/06/2016 2:15 PM Medical Record Number: [id number] Patient Account Number: [id number] Date of Birth/Sex: [birth year] (34 y.o. Female) Treating RN: Ashok CordiaPinkerton, Debi Primary Care Physician: Other Clinician: Referring Physician: Linwood DibblesKNAPP, JON Treating Physician/Extender: Elayne SnarePARKER, PETER Weeks in Treatment: 1 Active Inactive Abuse / Safety / Falls / Self Care Management Nursing Diagnoses: Potential for falls Goals: Patient will remain injury free Date Initiated: 12/30/2015 Goal Status: Active Interventions: Assess fall risk on admission and as needed Assess self care needs on admission and as needed Notes: Nutrition Nursing Diagnoses: Imbalanced nutrition Goals: Patient/caregiver agrees to and verbalizes  understanding of need to obtain nutritional consultation Date Initiated: 12/30/2015 Goal Status: Active Interventions: Assess patient nutrition upon admission and as needed per policy Notes: Orientation to the Wound Care Program Nursing Diagnoses: Knowledge deficit related to the wound healing center program Goals: Patient/caregiver will verbalize understanding of the [facility] [address] Kimberly Hendricks, Kimberly N. ([id number]) Date Initiated: 12/30/2015 Goal Status: Active Interventions: Provide education on orientation to the wound center Notes: Wound/Skin Impairment Nursing Diagnoses: Impaired tissue integrity Goals: Patient will have a decrease in wound volume by X% from date: (specify in notes) Date Initiated: 12/30/2015 Goal Status: Active Ulcer/skin breakdown will have a volume reduction of 30% by week 4 Date Initiated: 12/30/2015 Goal Status: Active Interventions: Assess ulceration(s) every visit Notes: Electronic Signature(s) Signed: 01/06/2016 4:03:46 PM By: Alejandro MullingPinkerton, Debra Entered By: Alejandro MullingPinkerton, Debra on 01/06/2016 14:25:01 Kimberly Hendricks, Kimberly N. ([id number]) -------------------------------------------------------------------------------- Pain Assessment Details Patient Name: Kimberly Hendricks, Kimberly N. Date of Service: 01/06/2016 2:15 PM Medical Record Number: [id number] Patient Account Number: [id number] Date of Birth/Sex: [birth year] (34 y.o. Female) Treating RN: Phillis HaggisPinkerton, Debi Primary Care Physician: Other Clinician: Referring Physician: Linwood DibblesKNAPP, JON Treating Physician/Extender: Elayne SnarePARKER, PETER Weeks in Treatment: 1 Active  Problems Location of Pain Severity and Description of Pain Patient Has Paino Yes Site Locations Pain Location: Pain in Ulcers With Dressing Change: Yes Duration of the Pain. Constant / Intermittento Constant Rate the pain. Current Pain Level: 5 Worst Pain Level: 10 Least Pain Level: 2 Character of Pain Describe the Pain: Shooting, Tender,  Throbbing Pain Management and Medication Current Pain Management: Electronic Signature(s) Signed: 01/06/2016 4:03:46 PM By: Alejandro Mulling Entered By: Alejandro Mulling on 01/06/2016 14:05:11 Kimberly Hendricks ([id number]) -------------------------------------------------------------------------------- Patient/Caregiver Education Details Patient Name: Kimberly Hendricks Date of Service: 01/06/2016 2:15 PM Medical Record Number: [id number] Patient Account Number: [id number] Date of Birth/Gender: 1981-02-10 (34 y.o. Female) Treating RN: Ashok Cordia, Debi Primary Care Physician: Other Clinician: Referring Physician: Linwood Dibbles Treating Physician/Extender: Elayne Snare in Treatment: 1 Education Assessment Education Provided To: Patient Education Topics Provided Wound/Skin Impairment: Handouts: Other: change dressing as ordered Methods: Demonstration, Explain/Verbal Responses: State content correctly Electronic Signature(s) Signed: 01/06/2016 4:03:46 PM By: Alejandro Mulling Entered By: Alejandro Mulling on 01/06/2016 15:00:36 Kimberly Hendricks ([id number]) -------------------------------------------------------------------------------- Wound Assessment Details Patient Name: Kimberly Hendricks Date of Service: 01/06/2016 2:15 PM Medical Record Number: [id number] Patient Account Number: [id number] Date of Birth/Sex: Feb 02, 1981 (34 y.o. Female) Treating RN: Ashok Cordia, Debi Primary Care Physician: Other Clinician: Referring Physician: Linwood Dibbles Treating Physician/Extender: Elayne Snare in Treatment: 1 Wound Status Wound Number: 1 Primary Etiology: 2nd degree Burn Wound Location: Right Lower Leg Wound Status: Open Wounding Event: Thermal Burn Date Acquired: 12/21/2015 Weeks Of Treatment: 1 Clustered Wound: Yes Photos Photo Uploaded By: Alejandro Mulling on 01/06/2016 16:12:49 Wound Measurements Length: (cm) 19 Width: (cm) 26 Depth: (cm) 0.2 Area: (cm)  387.987 Volume: (cm) 77.597 % Reduction in Area: 26.8% % Reduction in Volume: -46.4% Epithelialization: None Tunneling: No Wound Description Full Thickness Without Exposed Classification: Support Structures Wound Margin: Flat and Intact Exudate Large Amount: Exudate Type: Serosanguineous Exudate Color: red, brown Wound Bed Granulation Amount: Medium (34-66%) Exposed Structure Granulation Quality: Red, Kimberly Hendricks Fascia Exposed: No Necrotic Amount: Medium (34-66%) Fat Layer Exposed: No Vultaggio, Aston N. ([id number]) Necrotic Quality: Adherent Slough Tendon Exposed: No Muscle Exposed: No Joint Exposed: No Bone Exposed: No Limited to Skin Breakdown Periwound Skin Texture Texture Color No Abnormalities Noted: No No Abnormalities Noted: No Callus: No Atrophie Blanche: No Crepitus: No Cyanosis: No Excoriation: No Ecchymosis: No Fluctuance: No Erythema: No Friable: No Hemosiderin Staining: No Induration: No Mottled: No Localized Edema: No Pallor: No Rash: No Rubor: No Scarring: Yes Moisture No Abnormalities Noted: No Dry / Scaly: No Maceration: No Moist: Yes Wound Preparation Ulcer Cleansing: Other: cold water, Topical Anesthetic Applied: None Treatment Notes Wound #1 (Right Lower Leg) 1. Cleansed with: Cleanse wound with antibacterial soap and water 4. Dressing Applied: Silvadene Cream 5. Secondary Dressing Applied ABD Pad Kerlix/Conform Non-Adherent pad Petroleum gauze 7. Secured with Tape Notes netting Electronic Signature(s) Signed: 01/06/2016 4:03:46 PM By: Maylene Roes, Elnita Maxwell ([id number]) Entered By: Alejandro Mulling on 01/06/2016 14:23:41 Kimberly Hendricks ([id number]) -------------------------------------------------------------------------------- Wound Assessment Details Patient Name: Kimberly Hendricks Date of Service: 01/06/2016 2:15 PM Medical Record Number: [id number] Patient Account Number: [id number] Date of Birth/Sex:  07-26-1980 (34 y.o. Female) Treating RN: Ashok Cordia, Debi Primary Care Physician: Other Clinician: Referring Physician: Linwood Dibbles Treating Physician/Extender: Elayne Snare in Treatment: 1 Wound Status Wound Number: 2 Primary Etiology: 2nd degree Burn Wound Location: Left Lower Leg Wound Status: Open Wounding Event: Thermal Burn Date Acquired: 12/21/2015 Weeks Of Treatment: 1 Clustered Wound: Yes Photos Photo Uploaded By: Ashok Cordia,  Debra on 01/06/2016 16:12:50 Wound Measurements Length: (cm) 12 Width: (cm) 15 Depth: (cm) 0.1 Area: (cm) 141.372 Volume: (cm) 14.137 % Reduction in Area: 4% % Reduction in Volume: 4% Epithelialization: None Tunneling: No Undermining: No Wound Description Full Thickness Without Exposed Classification: Support Structures Wound Margin: Flat and Intact Exudate Large Amount: Exudate Type: Serosanguineous Exudate Color: red, brown Wound Bed Granulation Amount: Medium (34-66%) Exposed Structure Granulation Quality: Red, Kimberly Hendricks Fascia Exposed: No Necrotic Amount: Medium (34-66%) Fat Layer Exposed: No Richwine, Kelseigh N. ([id number]) Necrotic Quality: Adherent Slough Tendon Exposed: No Muscle Exposed: No Joint Exposed: No Bone Exposed: No Limited to Skin Breakdown Periwound Skin Texture Texture Color No Abnormalities Noted: No No Abnormalities Noted: No Callus: No Atrophie Blanche: No Crepitus: No Cyanosis: No Excoriation: No Ecchymosis: No Fluctuance: No Erythema: No Friable: No Hemosiderin Staining: No Induration: No Mottled: No Localized Edema: No Pallor: No Rash: No Rubor: No Scarring: Yes Temperature / Pain Moisture Tenderness on Palpation: Yes No Abnormalities Noted: No Dry / Scaly: No Maceration: No Moist: No Wound Preparation Ulcer Cleansing: Other: cold water, Topical Anesthetic Applied: None Treatment Notes Wound #2 (Left Lower Leg) 1. Cleansed with: Cleanse wound with antibacterial soap and  water 4. Dressing Applied: Silvadene Cream 5. Secondary Dressing Applied ABD Pad Kerlix/Conform Non-Adherent pad Petroleum gauze 7. Secured with Tape Notes netting Electronic Signature(s) Signed: 01/06/2016 4:03:46 PM By: Maylene Roes, Elnita Maxwell ([id number]) Entered By: Alejandro Mulling on 01/06/2016 14:20:46 Kimberly Hendricks ([id number]) -------------------------------------------------------------------------------- Vitals Details Patient Name: Kimberly Hendricks Date of Service: 01/06/2016 2:15 PM Medical Record Number: [id number] Patient Account Number: [id number] Date of Birth/Sex: [birth year] (34 y.o. Female) Treating RN: Ashok Cordia, Debi Primary Care Physician: Other Clinician: Referring Physician: Linwood Dibbles Treating Physician/Extender: Elayne Snare in Treatment: 1 Vital Signs Time Taken: 14:05 Temperature (F): 97.5 Height (in): 68 Pulse (bpm): 61 Weight (lbs): 298 Respiratory Rate (breaths/min): 18 Body Mass Index (BMI): 45.3 Blood Pressure (mmHg): 136/75 Reference Range: 80 - 120 mg / dl Electronic Signature(s) Signed: 01/06/2016 4:03:46 PM By: Alejandro Mulling Entered By: Alejandro Mulling on 01/06/2016 14:08:37

## 2016-01-13 ENCOUNTER — Encounter: Payer: 59 | Admitting: Surgery

## 2016-01-13 DIAGNOSIS — T24231A Burn of second degree of right lower leg, initial encounter: Secondary | ICD-10-CM | POA: Diagnosis not present
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
## 2016-01-14 NOTE — Progress Notes (Signed)
Kimberly Hendricks, Kimberly N. ([id number]) Visit Report for 01/13/2016 Arrival Information Details Patient Name: Kimberly Hendricks, Kimberly N. Date of Service: 01/13/2016 1:30 PM Medical Record Number: [id number] Patient Account Number: [id number] Date of Birth/Sex: 27-Jun-1980 (35 y.o. Female) Treating RN: Huel CoventryWoody, Kim Primary Care Physician: Other Clinician: Referring Physician: Linwood DibblesKNAPP, JON Treating Physician/Extender: Rudene ReBritto, Errol Weeks in Treatment: 2 Visit Information History Since Last Visit Added or deleted any medications: No Patient Arrived: Ambulatory Any new allergies or adverse reactions: No Arrival Time: 13:38 Had a fall or experienced change in No Accompanied By: self activities of daily living that may affect Transfer Assistance: None risk of falls: Patient Identification Verified: Yes Signs or symptoms of abuse/neglect since last No Secondary Verification Process Yes visito Completed: Hospitalized since last visit: No Patient Requires Transmission-Based No Has Dressing in Place as Prescribed: Yes Precautions: Pain Present Now: No Patient Has Alerts: No Electronic Signature(s) Signed: 01/13/2016 5:06:45 PM By: Elliot GurneyWoody, RN, BSN, Kim RN, BSN Entered By: Elliot GurneyWoody, RN, BSN, Kim on 01/13/2016 13:39:23 Kimberly Hendricks, Kimberly N. ([id number]) -------------------------------------------------------------------------------- Clinic Level of Care Assessment Details Patient Name: Kimberly Hendricks, Kimberly N. Date of Service: 01/13/2016 1:30 PM Medical Record Number: [id number] Patient Account Number: [id number] Date of Birth/Sex: 27-Jun-1980 (35 y.o. Female) Treating RN: Huel CoventryWoody, Kim Primary Care Physician: Other Clinician: Referring Physician: Linwood DibblesKNAPP, JON Treating Physician/Extender: Rudene ReBritto, Errol Weeks in Treatment: 2 Clinic Level of Care Assessment Items TOOL 4 Quantity Score []  - Use when only an EandM is performed on FOLLOW-UP visit 0 ASSESSMENTS - Nursing Assessment / Reassessment []  - Reassessment of  Co-morbidities (includes updates in patient status) 0 X - Reassessment of Adherence to Treatment Plan 1 5 ASSESSMENTS - Wound and Skin Assessment / Reassessment []  - Simple Wound Assessment / Reassessment - one wound 0 X - Complex Wound Assessment / Reassessment - multiple wounds 1 5 []  - Dermatologic / Skin Assessment (not related to wound area) 0 ASSESSMENTS - Focused Assessment []  - Circumferential Edema Measurements - multi extremities 0 []  - Nutritional Assessment / Counseling / Intervention 0 []  - Lower Extremity Assessment (monofilament, tuning fork, pulses) 0 []  - Peripheral Arterial Disease Assessment (using hand held doppler) 0 ASSESSMENTS - Ostomy and/or Continence Assessment and Care []  - Incontinence Assessment and Management 0 []  - Ostomy Care Assessment and Management (repouching, etc.) 0 PROCESS - Coordination of Care []  - Simple Patient / Family Education for ongoing care 0 X - Complex (extensive) Patient / Family Education for ongoing care 1 20 []  - Staff obtains ChiropractorConsents, Records, Test Results / Process Orders 0 []  - Staff telephones HHA, Nursing Homes / Clarify orders / etc 0 []  - Routine Transfer to another Facility (non-emergent condition) 0 Minier, Iwalani N. ([id number]) []  - Routine Hospital Admission (non-emergent condition) 0 []  - New Admissions / Manufacturing engineernsurance Authorizations / Ordering NPWT, Apligraf, etc. 0 []  - Emergency Hospital Admission (emergent condition) 0 X - Simple Discharge Coordination 1 10 []  - Complex (extensive) Discharge Coordination 0 PROCESS - Special Needs []  - Pediatric / Minor Patient Management 0 []  - Isolation Patient Management 0 []  - Hearing / Language / Visual special needs 0 []  - Assessment of Community assistance (transportation, D/C planning, etc.) 0 []  - Additional assistance / Altered mentation 0 []  - Support Surface(s) Assessment (bed, cushion, seat, etc.) 0 INTERVENTIONS - Wound Cleansing / Measurement []  - Simple Wound  Cleansing - one wound 0 X - Complex Wound Cleansing - multiple wounds 1 5 X - Wound Imaging (photographs - any number of wounds) 1 5 []  -  Wound Tracing (instead of photographs) 0 []  - Simple Wound Measurement - one wound 0 X - Complex Wound Measurement - multiple wounds 1 5 INTERVENTIONS - Wound Dressings []  - Small Wound Dressing one or multiple wounds 0 []  - Medium Wound Dressing one or multiple wounds 0 X - Large Wound Dressing one or multiple wounds 1 20 X - Application of Medications - topical 1 5 []  - Application of Medications - injection 0 INTERVENTIONS - Miscellaneous []  - External ear exam 0 Hazelbaker, Ruthene N. ([id number]) []  - Specimen Collection (cultures, biopsies, blood, body fluids, etc.) 0 []  - Specimen(s) / Culture(s) sent or taken to Lab for analysis 0 []  - Patient Transfer (multiple staff / Michiel Sites Lift / Similar devices) 0 []  - Simple Staple / Suture removal (25 or less) 0 []  - Complex Staple / Suture removal (26 or more) 0 []  - Hypo / Hyperglycemic Management (close monitor of Blood Glucose) 0 []  - Ankle / Brachial Index (ABI) - do not check if billed separately 0 X - Vital Signs 1 5 Has the patient been seen at the hospital within the last three years: Yes Total Score: 85 Level Of Care: New/Established - Level 3 Electronic Signature(s) Signed: 01/13/2016 5:06:45 PM By: Elliot Gurney, RN, BSN, Kim RN, BSN Entered By: Elliot Gurney, RN, BSN, Kim on 01/13/2016 14:13:54 Kimberly Hendricks ([id number]) -------------------------------------------------------------------------------- Encounter Discharge Information Details Patient Name: Kimberly Hendricks, Kimberly Hendricks Date of Service: 01/13/2016 1:30 PM Medical Record Number: [id number] Patient Account Number: [id number] Date of Birth/Sex: 1981-03-03 (35 y.o. Female) Treating RN: Huel Coventry Primary Care Physician: Other Clinician: Referring Physician: Linwood Dibbles Treating Physician/Extender: Rudene Re in Treatment: 2 Encounter Discharge  Information Items Discharge Pain Level: 0 Discharge Condition: Stable Ambulatory Status: Ambulatory Discharge Destination: Home Transportation: Private Auto Accompanied By: self Schedule Follow-up Appointment: Yes Medication Reconciliation completed and provided to Patient/Care Yes Aurianna Earlywine: Provided on Clinical Summary of Care: 01/13/2016 Form Type Recipient Paper Patient NW Electronic Signature(s) Signed: 01/13/2016 5:06:45 PM By: Elliot Gurney RN, BSN, Kim RN, BSN Previous Signature: 01/13/2016 2:13:03 PM Version By: Gwenlyn Perking Entered By: Elliot Gurney RN, BSN, Kim on 01/13/2016 14:14:46 Kimberly Hendricks ([id number]) -------------------------------------------------------------------------------- Lower Extremity Assessment Details Patient Name: Kimberly Hendricks, Kimberly Hendricks Date of Service: 01/13/2016 1:30 PM Medical Record Number: [id number] Patient Account Number: [id number] Date of Birth/Sex: Nov 28, 1980 (35 y.o. Female) Treating RN: Huel Coventry Primary Care Physician: Other Clinician: Referring Physician: Linwood Dibbles Treating Physician/Extender: Rudene Re in Treatment: 2 Vascular Assessment Pulses: Posterior Tibial Palpable: [Left:Yes] [Right:Yes] Dorsalis Pedis Palpable: [Left:Yes] [Right:Yes] Extremity colors, hair growth, and conditions: Extremity Color: [Left:Normal] [Right:Normal] Hair Growth on Extremity: [Left:Yes] [Right:Yes] Temperature of Extremity: [Left:Warm] [Right:Warm] Capillary Refill: [Left:< 3 seconds] [Right:< 3 seconds] Dependent Rubor: [Left:No] [Right:No] Blanched when Elevated: [Left:No] [Right:No] Lipodermatosclerosis: [Left:No] [Right:No] Toe Nail Assessment Left: Right: Thick: No No Discolored: No No Deformed: No No Improper Length and Hygiene: No No Electronic Signature(s) Signed: 01/13/2016 5:06:45 PM By: Elliot Gurney, RN, BSN, Kim RN, BSN Entered By: Elliot Gurney, RN, BSN, Kim on 01/13/2016 13:46:26 Kimberly Hendricks  ([id number]) -------------------------------------------------------------------------------- Multi Wound Chart Details Patient Name: Kimberly Hendricks Date of Service: 01/13/2016 1:30 PM Medical Record Number: [id number] Patient Account Number: [id number] Date of Birth/Sex: 1981-03-01 (35 y.o. Female) Treating RN: Huel Coventry Primary Care Physician: Other Clinician: Referring Physician: Linwood Dibbles Treating Physician/Extender: Rudene Re in Treatment: 2 Vital Signs Height(in): 68 Pulse(bpm): 64 Weight(lbs): 298 Blood Pressure 132/82 (mmHg): Body Mass Index(BMI): 45 Temperature(F): 98.1 Respiratory Rate 16 (breaths/min): Photos: [N/A:N/A] Wound Location:  Right Lower Leg Left Lower Leg N/A Wounding Event: Thermal Burn Thermal Burn N/A Primary Etiology: 2nd degree Burn 2nd degree Burn N/A Date Acquired: 12/21/2015 12/21/2015 N/A Weeks of Treatment: 2 2 N/A Wound Status: Open Open N/A Clustered Wound: Yes Yes N/A Measurements L x W x D 19x20x0.2 9x10x0.1 N/A (cm) Area (cm) : 298.451 70.686 N/A Volume (cm) : 59.69 7.069 N/A % Reduction in Area: 43.70% 52.00% N/A % Reduction in Volume: -12.60% 52.00% N/A Classification: Full Thickness Without Full Thickness Without N/A Exposed Support Exposed Support Structures Structures Exudate Amount: Large Large N/A Exudate Type: Serosanguineous Serosanguineous N/A Exudate Color: red, brown red, brown N/A Wound Margin: Flat and Intact Flat and Intact N/A Granulation Amount: Large (67-100%) Large (67-100%) N/A Granulation Quality: Red, Pink Red, Pink N/A Necrotic Amount: Small (1-33%) Small (1-33%) N/A Exposed Structures: N/A Kimberly Hendricks, Kimberly N. ([id number]) Fascia: No Fascia: No Fat: No Fat: No Tendon: No Tendon: No Muscle: No Muscle: No Joint: No Joint: No Bone: No Bone: No Limited to Skin Limited to Skin Breakdown Breakdown Epithelialization: Large (67-100%) Large (67-100%) N/A Periwound Skin Texture:  Excoriation: Yes Scarring: Yes N/A Scarring: Yes Edema: No Edema: No Excoriation: No Induration: No Induration: No Callus: No Callus: No Crepitus: No Crepitus: No Fluctuance: No Fluctuance: No Friable: No Friable: No Rash: No Rash: No Periwound Skin Moist: Yes Maceration: No N/A Moisture: Maceration: No Moist: No Dry/Scaly: No Dry/Scaly: No Periwound Skin Color: Ecchymosis: Yes Ecchymosis: Yes N/A Atrophie Blanche: No Atrophie Blanche: No Cyanosis: No Cyanosis: No Erythema: No Erythema: No Hemosiderin Staining: No Hemosiderin Staining: No Mottled: No Mottled: No Pallor: No Pallor: No Rubor: No Rubor: No Tenderness on No Yes N/A Palpation: Wound Preparation: Ulcer Cleansing: Other: Ulcer Cleansing: Other: N/A cold water cold water Topical Anesthetic Topical Anesthetic Applied: None Applied: None Treatment Notes Electronic Signature(s) Signed: 01/13/2016 5:06:45 PM By: Elliot Gurney, RN, BSN, Kim RN, BSN Entered By: Elliot Gurney, RN, BSN, Kim on 01/13/2016 13:51:23 Kimberly Hendricks ([id number]) -------------------------------------------------------------------------------- Multi-Disciplinary Care Plan Details Patient Name: Kimberly Hendricks, Kimberly Hendricks Date of Service: 01/13/2016 1:30 PM Medical Record Number: [id number] Patient Account Number: [id number] Date of Birth/Sex: July 15, 1980 (35 y.o. Female) Treating RN: Huel Coventry Primary Care Physician: Other Clinician: Referring Physician: Linwood Dibbles Treating Physician/Extender: Rudene Re in Treatment: 2 Active Inactive Abuse / Safety / Falls / Self Care Management Nursing Diagnoses: Potential for falls Goals: Patient will remain injury free Date Initiated: 12/30/2015 Goal Status: Active Interventions: Assess fall risk on admission and as needed Assess self care needs on admission and as needed Notes: Nutrition Nursing Diagnoses: Imbalanced nutrition Goals: Patient/caregiver agrees to and verbalizes  understanding of need to obtain nutritional consultation Date Initiated: 12/30/2015 Goal Status: Active Interventions: Assess patient nutrition upon admission and as needed per policy Notes: Orientation to the Wound Care Program Nursing Diagnoses: Knowledge deficit related to the wound healing center program Goals: Patient/caregiver will verbalize understanding of the [facility] [address] ADREANNA, FICKEL ([id number]) Date Initiated: 12/30/2015 Goal Status: Active Interventions: Provide education on orientation to the wound center Notes: Wound/Skin Impairment Nursing Diagnoses: Impaired tissue integrity Goals: Patient will have a decrease in wound volume by X% from date: (specify in notes) Date Initiated: 12/30/2015 Goal Status: Active Ulcer/skin breakdown will have a volume reduction of 30% by week 4 Date Initiated: 12/30/2015 Goal Status: Active Interventions: Assess ulceration(s) every visit Notes: Electronic Signature(s) Signed: 01/13/2016 5:06:45 PM By: Elliot Gurney, RN, BSN, Kim RN, BSN Entered By: Elliot Gurney, RN, BSN, Kim on 01/13/2016 13:50:44 Kimberly Hendricks, Kimberly N. (  [id number]) -------------------------------------------------------------------------------- Pain Assessment Details Patient Name: Kimberly Hendricks, SANKS. Date of Service: 01/13/2016 1:30 PM Medical Record Number: [id number] Patient Account Number: [id number] Date of Birth/Sex: 14-Aug-1980 (35 y.o. Female) Treating RN: Huel Coventry Primary Care Physician: Other Clinician: Referring Physician: Linwood Dibbles Treating Physician/Extender: Rudene Re in Treatment: 2 Active Problems Location of Pain Severity and Description of Pain Patient Has Paino No Site Locations With Dressing Change: No Pain Management and Medication Current Pain Management: Electronic Signature(s) Signed: 01/13/2016 5:06:45 PM By: Elliot Gurney, RN, BSN, Kim RN, BSN Entered By: Elliot Gurney, RN, BSN, Kim on 01/13/2016 13:39:29 Kimberly Hendricks  ([id number]) -------------------------------------------------------------------------------- Patient/Caregiver Education Details Patient Name: Kimberly Hendricks Date of Service: 01/13/2016 1:30 PM Medical Record Number: [id number] Patient Account Number: [id number] Date of Birth/Gender: Oct 08, 1980 (35 y.o. Female) Treating RN: Huel Coventry Primary Care Physician: Other Clinician: Referring Physician: Linwood Dibbles Treating Physician/Extender: Rudene Re in Treatment: 2 Education Assessment Education Provided To: Patient Education Topics Provided Wound/Skin Impairment: Handouts: Caring for Your Ulcer Methods: Demonstration Responses: State content correctly Electronic Signature(s) Signed: 01/13/2016 5:06:45 PM By: Elliot Gurney, RN, BSN, Kim RN, BSN Entered By: Elliot Gurney, RN, BSN, Kim on 01/13/2016 14:14:56 Kimberly Hendricks ([id number]) -------------------------------------------------------------------------------- Wound Assessment Details Patient Name: CATHIE, BONNELL Date of Service: 01/13/2016 1:30 PM Medical Record Number: [id number] Patient Account Number: [id number] Date of Birth/Sex: 01/19/1981 (35 y.o. Female) Treating RN: Huel Coventry Primary Care Physician: Other Clinician: Referring Physician: Linwood Dibbles Treating Physician/Extender: Rudene Re in Treatment: 2 Wound Status Wound Number: 1 Primary Etiology: 2nd degree Burn Wound Location: Right Lower Leg Wound Status: Open Wounding Event: Thermal Burn Date Acquired: 12/21/2015 Weeks Of Treatment: 2 Clustered Wound: Yes Photos Wound Measurements Length: (cm) 19 Width: (cm) 20 Depth: (cm) 0.2 Area: (cm) 298.451 Volume: (cm) 59.69 % Reduction in Area: 43.7% % Reduction in Volume: -12.6% Epithelialization: Large (67-100%) Tunneling: No Undermining: No Wound Description Full Thickness Without Exposed Classification: Support Structures Wound Margin: Flat and Intact Exudate Large Amount: Exudate  Type: Serosanguineous Exudate Color: red, brown Wound Bed Granulation Amount: Large (67-100%) Exposed Structure Granulation Quality: Red, Pink Fascia Exposed: No Necrotic Amount: Small (1-33%) Fat Layer Exposed: No Necrotic Quality: Adherent Slough Tendon Exposed: No Muscle Exposed: No Joint Exposed: No Eddie, Mickala N. ([id number]) Bone Exposed: No Limited to Skin Breakdown Periwound Skin Texture Texture Color No Abnormalities Noted: No No Abnormalities Noted: No Callus: No Atrophie Blanche: No Crepitus: No Cyanosis: No Excoriation: Yes Ecchymosis: Yes Fluctuance: No Erythema: No Friable: No Hemosiderin Staining: No Induration: No Mottled: No Localized Edema: No Pallor: No Rash: No Rubor: No Scarring: Yes Moisture No Abnormalities Noted: No Dry / Scaly: No Maceration: No Moist: Yes Wound Preparation Ulcer Cleansing: Other: cold water, Topical Anesthetic Applied: None Treatment Notes Wound #1 (Right Lower Leg) 1. Cleansed with: Cleanse wound with antibacterial soap and water 4. Dressing Applied: Silvadene Cream 5. Secondary Dressing Applied ABD Pad Kerlix/Conform Non-Adherent pad 7. Secured with Tape Notes Government social research officer) Signed: 01/13/2016 5:06:45 PM By: Elliot Gurney, RN, BSN, Kim RN, BSN Entered By: Elliot Gurney, RN, BSN, Kim on 01/13/2016 13:49:29 Kimberly Hendricks ([id number]) -------------------------------------------------------------------------------- Wound Assessment Details Patient Name: TRINIDY, MASTERSON Date of Service: 01/13/2016 1:30 PM Medical Record Number: [id number] Patient Account Number: [id number] Date of Birth/Sex: [birth year] (35 y.o. Female) Treating RN: Huel Coventry Primary Care Physician: Other Clinician: Referring Physician: Linwood Dibbles Treating Physician/Extender: Rudene Re in Treatment: 2 Wound Status Wound Number: 2 Primary Etiology: 2nd degree Burn Wound Location: Left Lower Leg Wound Status:  Open Wounding Event: Thermal Burn Date Acquired: 12/21/2015 Weeks Of Treatment: 2 Clustered Wound: Yes Photos Wound Measurements Length: (cm) 9 Width: (cm) 10 Depth: (cm) 0.1 Area: (cm) 70.686 Volume: (cm) 7.069 % Reduction in Area: 52% % Reduction in Volume: 52% Epithelialization: Large (67-100%) Tunneling: No Undermining: No Wound Description Full Thickness Without Exposed Classification: Support Structures Wound Margin: Flat and Intact Exudate Large Amount: Exudate Type: Serosanguineous Exudate Color: red, brown Wound Bed Granulation Amount: Large (67-100%) Exposed Structure Granulation Quality: Red, Pink Fascia Exposed: No Necrotic Amount: Small (1-33%) Fat Layer Exposed: No Necrotic Quality: Adherent Slough Tendon Exposed: No Muscle Exposed: No Joint Exposed: No Vilardi, Marayah N. ([id number]) Bone Exposed: No Limited to Skin Breakdown Periwound Skin Texture Texture Color No Abnormalities Noted: No No Abnormalities Noted: No Callus: No Atrophie Blanche: No Crepitus: No Cyanosis: No Excoriation: No Ecchymosis: Yes Fluctuance: No Erythema: No Friable: No Hemosiderin Staining: No Induration: No Mottled: No Localized Edema: No Pallor: No Rash: No Rubor: No Scarring: Yes Temperature / Pain Moisture Tenderness on Palpation: Yes No Abnormalities Noted: No Dry / Scaly: No Maceration: No Moist: No Wound Preparation Ulcer Cleansing: Other: cold water, Topical Anesthetic Applied: None Treatment Notes Wound #2 (Left Lower Leg) 1. Cleansed with: Cleanse wound with antibacterial soap and water 4. Dressing Applied: Silvadene Cream 5. Secondary Dressing Applied ABD Pad Kerlix/Conform Non-Adherent pad 7. Secured with Tape Notes Government social research officer) Signed: 01/13/2016 5:06:45 PM By: Elliot Gurney, RN, BSN, Kim RN, BSN Entered By: Elliot Gurney, RN, BSN, Kim on 01/13/2016 13:50:09 Kimberly Hendricks  ([id number]) -------------------------------------------------------------------------------- Vitals Details Patient Name: Kimberly Hendricks Date of Service: 01/13/2016 1:30 PM Medical Record Number: [id number] Patient Account Number: [id number] Date of Birth/Sex: [birth year] (35 y.o. Female) Treating RN: Huel Coventry Primary Care Physician: Other Clinician: Referring Physician: Linwood Dibbles Treating Physician/Extender: Rudene Re in Treatment: 2 Vital Signs Time Taken: 13:38 Temperature (F): 98.1 Height (in): 68 Pulse (bpm): 64 Weight (lbs): 298 Respiratory Rate (breaths/min): 16 Body Mass Index (BMI): 45.3 Blood Pressure (mmHg): 132/82 Reference Range: 80 - 120 mg / dl Electronic Signature(s) Signed: 01/13/2016 5:06:45 PM By: Elliot Gurney, RN, BSN, Kim RN, BSN Entered By: Elliot Gurney, RN, BSN, Kim on 01/13/2016 13:39:33

## 2016-01-20 ENCOUNTER — Encounter: Payer: 59 | Admitting: Surgery

## 2016-01-20 DIAGNOSIS — T24231A Burn of second degree of right lower leg, initial encounter: Secondary | ICD-10-CM | POA: Diagnosis not present

## 2016-01-20 NOTE — Progress Notes (Signed)
Kimberly, Hendricks ([id number]) Visit Report for 01/20/2016 Arrival Information Details Patient Name: Kimberly Hendricks, Kimberly Hendricks. Date of Service: 01/20/2016 1:30 PM Medical Record Number: [id number] Patient Account Number: [id number] Date of Birth/Sex: [birth year] (35 y.o. Female) Treating RN: Huel Coventry Primary Care Physician: Other Clinician: Referring Physician: Linwood Dibbles Treating Physician/Extender: Rudene Re in Treatment: 3 Visit Information History Since Last Visit Added or deleted any medications: No Patient Arrived: Ambulatory Any new allergies or adverse reactions: No Arrival Time: 13:44 Had a fall or experienced change in No Accompanied By: self activities of daily living that may affect Transfer Assistance: None risk of falls: Patient Identification Verified: Yes Signs or symptoms of abuse/neglect since last No Secondary Verification Process Yes visito Completed: Hospitalized since last visit: No Patient Requires Transmission-Based No Has Dressing in Place as Prescribed: Yes Precautions: Pain Present Now: No Patient Has Alerts: No Electronic Signature(s) Signed: 01/20/2016 3:33:39 PM By: Elliot Gurney, RN, BSN, Kim RN, BSN Entered By: Elliot Gurney, RN, BSN, Kim on 01/20/2016 13:44:40 Leodis Binet ([id number]) -------------------------------------------------------------------------------- Clinic Level of Care Assessment Details Patient Name: Kimberly, Hendricks Date of Service: 01/20/2016 1:30 PM Medical Record Number: [id number] Patient Account Number: [id number] Date of Birth/Sex: 1980/08/09 (35 y.o. Female) Treating RN: Huel Coventry Primary Care Physician: Other Clinician: Referring Physician: Linwood Dibbles Treating Physician/Extender: Rudene Re in Treatment: 3 Clinic Level of Care Assessment Items TOOL 4 Quantity Score []  - Use when only an EandM is performed on FOLLOW-UP visit 0 ASSESSMENTS - Nursing Assessment / Reassessment []  - Reassessment of  Co-morbidities (includes updates in patient status) 0 X - Reassessment of Adherence to Treatment Plan 1 5 ASSESSMENTS - Wound and Skin Assessment / Reassessment []  - Simple Wound Assessment / Reassessment - one wound 0 X - Complex Wound Assessment / Reassessment - multiple wounds 1 5 []  - Dermatologic / Skin Assessment (not related to wound area) 0 ASSESSMENTS - Focused Assessment []  - Circumferential Edema Measurements - multi extremities 0 []  - Nutritional Assessment / Counseling / Intervention 0 []  - Lower Extremity Assessment (monofilament, tuning fork, pulses) 0 []  - Peripheral Arterial Disease Assessment (using hand held doppler) 0 ASSESSMENTS - Ostomy and/or Continence Assessment and Care []  - Incontinence Assessment and Management 0 []  - Ostomy Care Assessment and Management (repouching, etc.) 0 PROCESS - Coordination of Care X - Simple Patient / Family Education for ongoing care 1 15 []  - Complex (extensive) Patient / Family Education for ongoing care 0 []  - Staff obtains Chiropractor, Records, Test Results / Process Orders 0 []  - Staff telephones HHA, Nursing Homes / Clarify orders / etc 0 []  - Routine Transfer to another Facility (non-emergent condition) 0 Malloy, Man N. ([id number]) []  - Routine Hospital Admission (non-emergent condition) 0 []  - New Admissions / Manufacturing engineer / Ordering NPWT, Apligraf, etc. 0 []  - Emergency Hospital Admission (emergent condition) 0 X - Simple Discharge Coordination 1 10 []  - Complex (extensive) Discharge Coordination 0 PROCESS - Special Needs []  - Pediatric / Minor Patient Management 0 []  - Isolation Patient Management 0 []  - Hearing / Language / Visual special needs 0 []  - Assessment of Community assistance (transportation, D/C planning, etc.) 0 []  - Additional assistance / Altered mentation 0 []  - Support Surface(s) Assessment (bed, cushion, seat, etc.) 0 INTERVENTIONS - Wound Cleansing / Measurement []  - Simple Wound  Cleansing - one wound 0 X - Complex Wound Cleansing - multiple wounds 1 5 X - Wound Imaging (photographs - any number of wounds) 1 5 []  -  Wound Tracing (instead of photographs) 0 []  - Simple Wound Measurement - one wound 0 X - Complex Wound Measurement - multiple wounds 1 5 INTERVENTIONS - Wound Dressings []  - Small Wound Dressing one or multiple wounds 0 X - Medium Wound Dressing one or multiple wounds 2 15 []  - Large Wound Dressing one or multiple wounds 0 X - Application of Medications - topical 1 5 []  - Application of Medications - injection 0 INTERVENTIONS - Miscellaneous []  - External ear exam 0 Bobier, Samora N. ([id number]) []  - Specimen Collection (cultures, biopsies, blood, body fluids, etc.) 0 []  - Specimen(s) / Culture(s) sent or taken to Lab for analysis 0 []  - Patient Transfer (multiple staff / Michiel Sites Lift / Similar devices) 0 []  - Simple Staple / Suture removal (25 or less) 0 []  - Complex Staple / Suture removal (26 or more) 0 []  - Hypo / Hyperglycemic Management (close monitor of Blood Glucose) 0 []  - Ankle / Brachial Index (ABI) - do not check if billed separately 0 X - Vital Signs 1 5 Has the patient been seen at the hospital within the last three years: Yes Total Score: 90 Level Of Care: New/Established - Level 3 Electronic Signature(s) Signed: 01/20/2016 3:33:39 PM By: Elliot Gurney, RN, BSN, Kim RN, BSN Entered By: Elliot Gurney, RN, BSN, Kim on 01/20/2016 14:12:35 Leodis Binet ([id number]) -------------------------------------------------------------------------------- Encounter Discharge Information Details Patient Name: Kimberly, Hendricks Date of Service: 01/20/2016 1:30 PM Medical Record Number: [id number] Patient Account Number: [id number] Date of Birth/Sex: 1980/11/18 (35 y.o. Female) Treating RN: Huel Coventry Primary Care Physician: Other Clinician: Referring Physician: Linwood Dibbles Treating Physician/Extender: Rudene Re in Treatment: 3 Encounter Discharge  Information Items Discharge Pain Level: 0 Discharge Condition: Stable Ambulatory Status: Ambulatory Discharge Destination: Home Transportation: Private Auto Accompanied By: self Schedule Follow-up Appointment: Yes Medication Reconciliation completed and provided to Patient/Care Yes Jisel Fleet: Provided on Clinical Summary of Care: 01/20/2016 Form Type Recipient Paper Patient MW Electronic Signature(s) Signed: 01/20/2016 3:33:39 PM By: Elliot Gurney RN, BSN, Kim RN, BSN Previous Signature: 01/20/2016 2:12:26 PM Version By: Gwenlyn Perking Entered By: Elliot Gurney RN, BSN, Kim on 01/20/2016 14:13:25 Leodis Binet ([id number]) -------------------------------------------------------------------------------- Lower Extremity Assessment Details Patient Name: YANELI, KEITHLEY Date of Service: 01/20/2016 1:30 PM Medical Record Number: [id number] Patient Account Number: [id number] Date of Birth/Sex: 05/01/1980 (35 y.o. Female) Treating RN: Huel Coventry Primary Care Physician: Other Clinician: Referring Physician: Linwood Dibbles Treating Physician/Extender: Rudene Re in Treatment: 3 Vascular Assessment Pulses: Posterior Tibial Dorsalis Pedis Palpable: [Left:Yes] [Right:Yes] Extremity colors, hair growth, and conditions: Extremity Color: [Left:Red] [Right:Red] Hair Growth on Extremity: [Left:Yes] [Right:Yes] Temperature of Extremity: [Left:Warm] [Right:Warm] Capillary Refill: [Left:< 3 seconds] [Right:< 3 seconds] Dependent Rubor: [Left:No] [Right:No] Blanched when Elevated: [Left:No] [Right:No] Lipodermatosclerosis: [Left:No] [Right:No] Toe Nail Assessment Left: Right: Thick: No No Discolored: No No Deformed: No No Improper Length and Hygiene: No No Electronic Signature(s) Signed: 01/20/2016 3:33:39 PM By: Elliot Gurney, RN, BSN, Kim RN, BSN Entered By: Elliot Gurney, RN, BSN, Kim on 01/20/2016 13:49:39 Leodis Binet  ([id number]) -------------------------------------------------------------------------------- Multi Wound Chart Details Patient Name: Leodis Binet Date of Service: 01/20/2016 1:30 PM Medical Record Number: [id number] Patient Account Number: [id number] Date of Birth/Sex: [birth year] (35 y.o. Female) Treating RN: Huel Coventry Primary Care Physician: Other Clinician: Referring Physician: Linwood Dibbles Treating Physician/Extender: Rudene Re in Treatment: 3 Vital Signs Height(in): 68 Pulse(bpm): 71 Weight(lbs): 298 Blood Pressure 142/71 (mmHg): Body Mass Index(BMI): 45 Temperature(F): 97.6 Respiratory Rate 16 (breaths/min): Photos: [N/A:N/A] Wound Location: Right Lower Leg  Left Lower Leg N/A Wounding Event: Thermal Burn Thermal Burn N/A Primary Etiology: 2nd degree Burn 2nd degree Burn N/A Date Acquired: 12/21/2015 12/21/2015 N/A Weeks of Treatment: 3 3 N/A Wound Status: Open Open N/A Clustered Wound: Yes Yes N/A Measurements L x W x D 16x18x0.1 6x9x0.1 N/A (cm) Area (cm) : 226.195 42.412 N/A Volume (cm) : 22.619 4.241 N/A % Reduction in Area: 57.30% 71.20% N/A % Reduction in Volume: 57.30% 71.20% N/A Classification: Full Thickness Without Full Thickness Without N/A Exposed Support Exposed Support Structures Structures Exudate Amount: Medium Large N/A Exudate Type: Serosanguineous Serosanguineous N/A Exudate Color: red, brown red, brown N/A Wound Margin: Flat and Intact Flat and Intact N/A Granulation Amount: Large (67-100%) Large (67-100%) N/A Granulation Quality: Red, Pink Red, Pink N/A Necrotic Amount: Small (1-33%) Small (1-33%) N/A Exposed Structures: N/A DARE, SPILLMAN N. ([id number]) Fascia: No Fascia: No Fat: No Fat: No Tendon: No Tendon: No Muscle: No Muscle: No Joint: No Joint: No Bone: No Bone: No Limited to Skin Limited to Skin Breakdown Breakdown Epithelialization: Large (67-100%) Large (67-100%) N/A Periwound Skin Texture:  Excoriation: Yes Excoriation: Yes N/A Scarring: Yes Scarring: Yes Edema: No Edema: No Induration: No Induration: No Callus: No Callus: No Crepitus: No Crepitus: No Fluctuance: No Fluctuance: No Friable: No Friable: No Rash: No Rash: No Periwound Skin Moist: Yes Moist: Yes N/A Moisture: Maceration: No Maceration: No Dry/Scaly: No Dry/Scaly: No Periwound Skin Color: Ecchymosis: Yes Ecchymosis: Yes N/A Atrophie Blanche: No Atrophie Blanche: No Cyanosis: No Cyanosis: No Erythema: No Erythema: No Hemosiderin Staining: No Hemosiderin Staining: No Mottled: No Mottled: No Pallor: No Pallor: No Rubor: No Rubor: No Temperature: No Abnormality N/A N/A Tenderness on Yes Yes N/A Palpation: Wound Preparation: Ulcer Cleansing: Other: Ulcer Cleansing: Other: N/A cold water cold water Topical Anesthetic Topical Anesthetic Applied: None Applied: None Treatment Notes Electronic Signature(s) Signed: 01/20/2016 3:33:39 PM By: Elliot Gurney, RN, BSN, Kim RN, BSN Entered By: Elliot Gurney, RN, BSN, Kim on 01/20/2016 14:10:05 Leodis Binet ([id number]) -------------------------------------------------------------------------------- Multi-Disciplinary Care Plan Details Patient Name: MALAIYA, PACZKOWSKI Date of Service: 01/20/2016 1:30 PM Medical Record Number: [id number] Patient Account Number: [id number] Date of Birth/Sex: Jul 04, 1980 (35 y.o. Female) Treating RN: Huel Coventry Primary Care Physician: Other Clinician: Referring Physician: Linwood Dibbles Treating Physician/Extender: Rudene Re in Treatment: 3 Active Inactive Abuse / Safety / Falls / Self Care Management Nursing Diagnoses: Potential for falls Goals: Patient will remain injury free Date Initiated: 12/30/2015 Goal Status: Active Interventions: Assess fall risk on admission and as needed Assess self care needs on admission and as needed Notes: Nutrition Nursing Diagnoses: Imbalanced  nutrition Goals: Patient/caregiver agrees to and verbalizes understanding of need to obtain nutritional consultation Date Initiated: 12/30/2015 Goal Status: Active Interventions: Assess patient nutrition upon admission and as needed per policy Notes: Orientation to the Wound Care Program Nursing Diagnoses: Knowledge deficit related to the wound healing center program Goals: Patient/caregiver will verbalize understanding of the Wound Healing Center [address] SELISA, TENSLEY ([id number]) Date Initiated: 12/30/2015 Goal Status: Active Interventions: Provide education on orientation to the wound center Notes: Wound/Skin Impairment Nursing Diagnoses: Impaired tissue integrity Goals: Patient will have a decrease in wound volume by X% from date: (specify in notes) Date Initiated: 12/30/2015 Goal Status: Active Ulcer/skin breakdown will have a volume reduction of 30% by week 4 Date Initiated: 12/30/2015 Goal Status: Active Interventions: Assess ulceration(s) every visit Notes: Electronic Signature(s) Signed: 01/20/2016 3:33:39 PM By: Elliot Gurney, RN, BSN, Kim RN, BSN Entered By: Elliot Gurney, RN, BSN, Kim on 01/20/2016 13:57:04 Corrie,  Kathyrn Dorris CarnesN. ([id number]) -------------------------------------------------------------------------------- Pain Assessment Details Patient Name: Leodis BinetWALKER, Desia N. Date of Service: 01/20/2016 1:30 PM Medical Record Number: [id number] Patient Account Number: [id number] Date of Birth/Sex: 06-14-1980 (35 y.o. Female) Treating RN: Huel CoventryWoody, Kim Primary Care Physician: Other Clinician: Referring Physician: Linwood DibblesKNAPP, JON Treating Physician/Extender: Rudene ReBritto, Errol Weeks in Treatment: 3 Active Problems Location of Pain Severity and Description of Pain Patient Has Paino No Site Locations With Dressing Change: No Pain Management and Medication Current Pain Management: Electronic Signature(s) Signed: 01/20/2016 3:33:39 PM By: Elliot GurneyWoody, RN, BSN, Kim RN, BSN Entered By: Elliot GurneyWoody, RN,  BSN, Kim on 01/20/2016 13:44:47 Leodis BinetWALKER, Aryahi N. ([id number]) -------------------------------------------------------------------------------- Patient/Caregiver Education Details Patient Name: Leodis BinetWALKER, Aeron N. Date of Service: 01/20/2016 1:30 PM Medical Record Number: [id number] Patient Account Number: [id number] Date of Birth/Gender: 06-14-1980 (35 y.o. Female) Treating RN: Huel CoventryWoody, Kim Primary Care Physician: Other Clinician: Referring Physician: Linwood DibblesKNAPP, JON Treating Physician/Extender: Rudene ReBritto, Errol Weeks in Treatment: 3 Education Assessment Education Provided To: Patient Education Topics Provided Wound/Skin Impairment: Handouts: Caring for Your Ulcer Methods: Demonstration Responses: State content correctly Electronic Signature(s) Signed: 01/20/2016 3:33:39 PM By: Elliot GurneyWoody, RN, BSN, Kim RN, BSN Entered By: Elliot GurneyWoody, RN, BSN, Kim on 01/20/2016 14:13:41 Leodis BinetWALKER, Vitoria N. ([id number]) -------------------------------------------------------------------------------- Wound Assessment Details Patient Name: Leodis BinetWALKER, Jullian N. Date of Service: 01/20/2016 1:30 PM Medical Record Number: [id number] Patient Account Number: [id number] Date of Birth/Sex: 06-14-1980 (35 y.o. Female) Treating RN: Huel CoventryWoody, Kim Primary Care Physician: Other Clinician: Referring Physician: Linwood DibblesKNAPP, JON Treating Physician/Extender: Rudene ReBritto, Errol Weeks in Treatment: 3 Wound Status Wound Number: 1 Primary Etiology: 2nd degree Burn Wound Location: Right Lower Leg Wound Status: Open Wounding Event: Thermal Burn Date Acquired: 12/21/2015 Weeks Of Treatment: 3 Clustered Wound: Yes Photos Wound Measurements Length: (cm) 16 Width: (cm) 18 Depth: (cm) 0.1 Area: (cm) 226.195 Volume: (cm) 22.619 % Reduction in Area: 57.3% % Reduction in Volume: 57.3% Epithelialization: Large (67-100%) Tunneling: No Undermining: No Wound Description Full Thickness Without Exposed Classification: Support Structures Wound Margin:  Flat and Intact Exudate Medium Amount: Exudate Type: Serosanguineous Exudate Color: red, brown Wound Bed Granulation Amount: Large (67-100%) Exposed Structure Granulation Quality: Red, Pink Fascia Exposed: No Necrotic Amount: Small (1-33%) Fat Layer Exposed: No Necrotic Quality: Adherent Slough Tendon Exposed: No Muscle Exposed: No Joint Exposed: No Lartigue, Witney N. ([id number]) Bone Exposed: No Limited to Skin Breakdown Periwound Skin Texture Texture Color No Abnormalities Noted: No No Abnormalities Noted: No Callus: No Atrophie Blanche: No Crepitus: No Cyanosis: No Excoriation: Yes Ecchymosis: Yes Fluctuance: No Erythema: No Friable: No Hemosiderin Staining: No Induration: No Mottled: No Localized Edema: No Pallor: No Rash: No Rubor: No Scarring: Yes Temperature / Pain Moisture Temperature: No Abnormality No Abnormalities Noted: No Tenderness on Palpation: Yes Dry / Scaly: No Maceration: No Moist: Yes Wound Preparation Ulcer Cleansing: Other: cold water, Topical Anesthetic Applied: None Treatment Notes Wound #1 (Right Lower Leg) 1. Cleansed with: Cleanse wound with antibacterial soap and water 4. Dressing Applied: Silvadene Cream 5. Secondary Dressing Applied Kerlix/Conform Non-Adherent pad Electronic Signature(s) Signed: 01/20/2016 3:33:39 PM By: Elliot GurneyWoody, RN, BSN, Kim RN, BSN Entered By: Elliot GurneyWoody, RN, BSN, Kim on 01/20/2016 13:55:48 Leodis BinetWALKER, Lynnmarie N. ([id number]) -------------------------------------------------------------------------------- Wound Assessment Details Patient Name: Leodis BinetWALKER, Zenith N. Date of Service: 01/20/2016 1:30 PM Medical Record Number: [id number] Patient Account Number: [id number] Date of Birth/Sex: 06-14-1980 (35 y.o. Female) Treating RN: Huel CoventryWoody, Kim Primary Care Physician: Other Clinician: Referring Physician: Linwood DibblesKNAPP, JON Treating Physician/Extender: Rudene ReBritto, Errol Weeks in Treatment: 3 Wound Status Wound Number: 2 Primary  Etiology: 2nd degree Burn Wound Location:  Left Lower Leg Wound Status: Open Wounding Event: Thermal Burn Date Acquired: 12/21/2015 Weeks Of Treatment: 3 Clustered Wound: Yes Photos Wound Measurements Length: (cm) 6 Width: (cm) 9 Depth: (cm) 0.1 Area: (cm) 42.412 Volume: (cm) 4.241 % Reduction in Area: 71.2% % Reduction in Volume: 71.2% Epithelialization: Large (67-100%) Tunneling: No Undermining: No Wound Description Full Thickness Without Exposed Classification: Support Structures Wound Margin: Flat and Intact Exudate Large Amount: Exudate Type: Serosanguineous Exudate Color: red, brown Wound Bed Granulation Amount: Large (67-100%) Exposed Structure Granulation Quality: Red, Pink Fascia Exposed: No Necrotic Amount: Small (1-33%) Fat Layer Exposed: No Necrotic Quality: Adherent Slough Tendon Exposed: No Muscle Exposed: No Joint Exposed: No Beals, Lennox N. ([id number]) Bone Exposed: No Limited to Skin Breakdown Periwound Skin Texture Texture Color No Abnormalities Noted: No No Abnormalities Noted: No Callus: No Atrophie Blanche: No Crepitus: No Cyanosis: No Excoriation: Yes Ecchymosis: Yes Fluctuance: No Erythema: No Friable: No Hemosiderin Staining: No Induration: No Mottled: No Localized Edema: No Pallor: No Rash: No Rubor: No Scarring: Yes Temperature / Pain Moisture Tenderness on Palpation: Yes No Abnormalities Noted: No Dry / Scaly: No Maceration: No Moist: Yes Wound Preparation Ulcer Cleansing: Other: cold water, Topical Anesthetic Applied: None Treatment Notes Wound #2 (Left Lower Leg) 1. Cleansed with: Cleanse wound with antibacterial soap and water 4. Dressing Applied: Silvadene Cream 5. Secondary Dressing Applied Kerlix/Conform Non-Adherent pad Electronic Signature(s) Signed: 01/20/2016 3:33:39 PM By: Elliot GurneyWoody, RN, BSN, Kim RN, BSN Entered By: Elliot GurneyWoody, RN, BSN, Kim on 01/20/2016 13:56:33 Leodis BinetWALKER, Alayasia N.  ([id number]) -------------------------------------------------------------------------------- Vitals Details Patient Name: Leodis BinetWALKER, Devanny N. Date of Service: 01/20/2016 1:30 PM Medical Record Number: [id number] Patient Account Number: [id number] Date of Birth/Sex: 1980-05-09 (35 y.o. Female) Treating RN: Huel CoventryWoody, Kim Primary Care Physician: Other Clinician: Referring Physician: Linwood DibblesKNAPP, JON Treating Physician/Extender: Rudene ReBritto, Errol Weeks in Treatment: 3 Vital Signs Time Taken: 13:44 Temperature (F): 97.6 Height (in): 68 Pulse (bpm): 71 Weight (lbs): 298 Respiratory Rate (breaths/min): 16 Body Mass Index (BMI): 45.3 Blood Pressure (mmHg): 142/71 Reference Range: 80 - 120 mg / dl Electronic Signature(s) Signed: 01/20/2016 3:33:39 PM By: Elliot GurneyWoody, RN, BSN, Kim RN, BSN Entered By: Elliot GurneyWoody, RN, BSN, Kim on 01/20/2016 13:45:12
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
## 2016-02-03 ENCOUNTER — Encounter: Payer: 59 | Attending: Surgery | Admitting: Surgery

## 2016-02-03 DIAGNOSIS — T24232A Burn of second degree of left lower leg, initial encounter: Secondary | ICD-10-CM | POA: Insufficient documentation

## 2016-02-03 DIAGNOSIS — Z6841 Body Mass Index (BMI) 40.0 and over, adult: Secondary | ICD-10-CM | POA: Insufficient documentation

## 2016-02-03 DIAGNOSIS — X088XXA Exposure to other specified smoke, fire and flames, initial encounter: Secondary | ICD-10-CM | POA: Insufficient documentation

## 2016-02-03 DIAGNOSIS — T24231A Burn of second degree of right lower leg, initial encounter: Secondary | ICD-10-CM | POA: Insufficient documentation
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
## 2016-02-03 NOTE — Progress Notes (Signed)
GENEVER, HENTGES ([id number]) Visit Report for 02/03/2016 Arrival Information Details Patient Name: Kimberly Hendricks, Kimberly Hendricks 02/03/2016 1:30 Date of Service: PM Medical Record [id number] Number: Patient Account Number: [id number] 1981-01-12 (35 y.o. Treating RN: Huel Coventry Date of Birth/Sex: Female) Other Clinician: Primary Care Physician: Treating Evlyn Kanner Referring Physician: Linwood Dibbles Physician/Extender: Tania Ade in Treatment: 5 Visit Information History Since Last Visit Added or deleted any medications: No Patient Arrived: Ambulatory Any new allergies or adverse reactions: No Arrival Time: 13:42 Had a fall or experienced change in No Accompanied By: self activities of daily living that may affect Transfer Assistance: None risk of falls: Patient Identification Verified: Yes Signs or symptoms of abuse/neglect since last No Secondary Verification Process Yes visito Completed: Hospitalized since last visit: No Patient Requires Transmission-Based No Has Dressing in Place as Prescribed: Yes Precautions: Pain Present Now: No Patient Has Alerts: No Electronic Signature(s) Signed: 02/03/2016 2:18:02 PM By: Elliot Gurney, RN, BSN, Kim RN, BSN Entered By: Elliot Gurney, RN, BSN, Kim on 02/03/2016 13:42:27 Kimberly Hendricks ([id number]) -------------------------------------------------------------------------------- Clinic Level of Care Assessment Details Patient Name: Kimberly Hendricks, Kimberly Hendricks 02/03/2016 1:30 Date of Service: PM Medical Record [id number] Number: Patient Account Number: [id number] 1980/12/17 (35 y.o. Treating RN: Huel Coventry Date of Birth/Sex: Female) Other Clinician: Primary Care Physician: Treating Evlyn Kanner Referring Physician: Linwood Dibbles Physician/Extender: Tania Ade in Treatment: 5 Clinic Level of Care Assessment Items TOOL 4 Quantity Score []  - Use when only an EandM is performed on FOLLOW-UP visit 0 ASSESSMENTS - Nursing Assessment / Reassessment []  - Reassessment of  Co-morbidities (includes updates in patient status) 0 X - Reassessment of Adherence to Treatment Plan 1 5 ASSESSMENTS - Wound and Skin Assessment / Reassessment []  - Simple Wound Assessment / Reassessment - one wound 0 X - Complex Wound Assessment / Reassessment - multiple wounds 2 5 []  - Dermatologic / Skin Assessment (not related to wound area) 0 ASSESSMENTS - Focused Assessment []  - Circumferential Edema Measurements - multi extremities 0 []  - Nutritional Assessment / Counseling / Intervention 0 []  - Lower Extremity Assessment (monofilament, tuning fork, pulses) 0 []  - Peripheral Arterial Disease Assessment (using hand held doppler) 0 ASSESSMENTS - Ostomy and/or Continence Assessment and Care []  - Incontinence Assessment and Management 0 []  - Ostomy Care Assessment and Management (repouching, etc.) 0 PROCESS - Coordination of Care []  - Simple Patient / Family Education for ongoing care 0 []  - Complex (extensive) Patient / Family Education for ongoing care 0 []  - Staff obtains Chiropractor, Records, Test Results / Process Orders 0 []  - Staff telephones HHA, Nursing Homes / Clarify orders / etc 0 KIMA, MALENFANT. ([id number]) []  - Routine Transfer to another Facility (non-emergent condition) 0 []  - Routine Hospital Admission (non-emergent condition) 0 []  - New Admissions / Manufacturing engineer / Ordering NPWT, Apligraf, etc. 0 []  - Emergency Hospital Admission (emergent condition) 0 []  - Simple Discharge Coordination 0 []  - Complex (extensive) Discharge Coordination 0 PROCESS - Special Needs []  - Pediatric / Minor Patient Management 0 []  - Isolation Patient Management 0 []  - Hearing / Language / Visual special needs 0 []  - Assessment of Community assistance (transportation, D/C planning, etc.) 0 []  - Additional assistance / Altered mentation 0 []  - Support Surface(s) Assessment (bed, cushion, seat, etc.) 0 INTERVENTIONS - Wound Cleansing / Measurement []  - Simple Wound Cleansing -  one wound 0 []  - Complex Wound Cleansing - multiple wounds 0 X - Wound Imaging (photographs - any number of wounds) 1 5 []  - Wound Tracing (instead  of photographs) 0 []  - Simple Wound Measurement - one wound 0 []  - Complex Wound Measurement - multiple wounds 0 INTERVENTIONS - Wound Dressings []  - Small Wound Dressing one or multiple wounds 0 []  - Medium Wound Dressing one or multiple wounds 0 []  - Large Wound Dressing one or multiple wounds 0 []  - Application of Medications - topical 0 []  - Application of Medications - injection 0 Hoeffner, Aliea N. ([id number]) INTERVENTIONS - Miscellaneous []  - External ear exam 0 []  - Specimen Collection (cultures, biopsies, blood, body fluids, etc.) 0 []  - Specimen(s) / Culture(s) sent or taken to Lab for analysis 0 []  - Patient Transfer (multiple staff / Michiel Sites Lift / Similar devices) 0 []  - Simple Staple / Suture removal (25 or less) 0 []  - Complex Staple / Suture removal (26 or more) 0 []  - Hypo / Hyperglycemic Management (close monitor of Blood Glucose) 0 []  - Ankle / Brachial Index (ABI) - do not check if billed separately 0 X - Vital Signs 1 5 Has the patient been seen at the hospital within the last three years: Yes Total Score: 25 Level Of Care: New/Established - Level 1 Electronic Signature(s) Signed: 02/03/2016 2:18:02 PM By: Elliot Gurney, RN, BSN, Kim RN, BSN Entered By: Elliot Gurney, RN, BSN, Kim on 02/03/2016 13:59:47 Kimberly Hendricks ([id number]) -------------------------------------------------------------------------------- Encounter Discharge Information Details Patient Name: Kimberly Hendricks, Kimberly Hendricks 02/03/2016 1:30 Date of Service: PM Medical Record [id number] Number: Patient Account Number: [id number] 1980-04-28 (35 y.o. Treating RN: Huel Coventry Date of Birth/Sex: Female) Other Clinician: Primary Care Physician: Treating Evlyn Kanner Referring Physician: Linwood Dibbles Physician/Extender: Tania Ade in Treatment: 5 Encounter Discharge Information  Items Discharge Pain Level: 0 Discharge Condition: Stable Ambulatory Status: Ambulatory Discharge Destination: Home Transportation: Private Auto Accompanied By: self Schedule Follow-up Appointment: No Medication Reconciliation completed and provided to Patient/Care Yes Mycah Mcdougall: Provided on Clinical Summary of Care: 02/03/2016 Form Type Recipient Paper Patient MW Electronic Signature(s) Signed: 02/03/2016 2:18:02 PM By: Elliot Gurney RN, BSN, Kim RN, BSN Previous Signature: 02/03/2016 1:57:40 PM Version By: Gwenlyn Perking Entered By: Elliot Gurney RN, BSN, Kim on 02/03/2016 14:00:15 Kimberly Hendricks ([id number]) -------------------------------------------------------------------------------- Lower Extremity Assessment Details Patient Name: Kimberly Hendricks, Kimberly Hendricks 02/03/2016 1:30 Date of Service: PM Medical Record [id number] Number: Patient Account Number: [id number] 06/22/1980 (35 y.o. Treating RN: Huel Coventry Date of Birth/Sex: Female) Other Clinician: Primary Care Physician: Treating Evlyn Kanner Referring Physician: Linwood Dibbles Physician/Extender: Weeks in Treatment: 5 Vascular Assessment Pulses: Posterior Tibial Dorsalis Pedis Palpable: [Left:Yes] [Right:Yes] Extremity colors, hair growth, and conditions: Extremity Color: [Left:Normal] [Right:Normal] Hair Growth on Extremity: [Left:Yes] [Right:Yes] Temperature of Extremity: [Left:Warm] [Right:Warm] Capillary Refill: [Left:< 3 seconds] [Right:< 3 seconds] Dependent Rubor: [Left:No] [Right:No] Blanched when Elevated: [Left:No] [Right:No] Lipodermatosclerosis: [Left:No] [Right:No] Toe Nail Assessment Left: Right: Thick: No No Discolored: No No Deformed: No No Improper Length and Hygiene: No No Electronic Signature(s) Signed: 02/03/2016 2:18:02 PM By: Elliot Gurney, RN, BSN, Kim RN, BSN Entered By: Elliot Gurney, RN, BSN, Kim on 02/03/2016 13:57:15 Kimberly Hendricks  ([id number]) -------------------------------------------------------------------------------- Multi-Disciplinary Care Plan Details Patient Name: Kimberly Hendricks, Kimberly Hendricks 02/03/2016 1:30 Date of Service: PM Medical Record [id number] Number: Patient Account Number: [id number] 03/12/1981 (35 y.o. Treating RN: Huel Coventry Date of Birth/Sex: Female) Other Clinician: Primary Care Physician: Treating Evlyn Kanner Referring Physician: Linwood Dibbles Physician/Extender: Tania Ade in Treatment: 5 Active Inactive Electronic Signature(s) Signed: 02/03/2016 2:18:02 PM By: Elliot Gurney, RN, BSN, Kim RN, BSN Entered By: Elliot Gurney, RN, BSN, Kim on 02/03/2016 13:59:13 Kimberly Hendricks ([id number]) -------------------------------------------------------------------------------- Pain Assessment Details Patient Name: Kimberly Hendricks,  Kimberly N. 02/03/2016 1:30 Date of Service: PM Medical Record [id number] Number: Patient Account Number: [id number] 09-07-1980 (35 y.o. Treating RN: Huel CoventryWoody, Kim Date of Birth/Sex: Female) Other Clinician: Primary Care Physician: Treating Evlyn KannerBritto, Errol Referring Physician: Linwood DibblesKNAPP, JON Physician/Extender: Tania AdeWeeks in Treatment: 5 Active Problems Location of Pain Severity and Description of Pain Patient Has Paino No Site Locations With Dressing Change: Yes Character of Pain Describe the Pain: Tender Pain Management and Medication Current Pain Management: Electronic Signature(s) Signed: 02/03/2016 2:18:02 PM By: Elliot GurneyWoody, RN, BSN, Kim RN, BSN Entered By: Elliot GurneyWoody, RN, BSN, Kim on 02/03/2016 13:42:37 Kimberly BinetWALKER, Kimberly N. ([id number]) -------------------------------------------------------------------------------- Patient/Caregiver Education Details Patient Name: Kimberly BinetWALKER, Kimberly N. 02/03/2016 1:30 Date of Service: PM Medical Record [id number] Number: Patient Account Number: [id number] 09-07-1980 (35 y.o. Treating RN: Huel CoventryWoody, Kim Date of Birth/Gender: Female) Other Clinician: Primary Care Physician:  Treating Evlyn KannerBritto, Errol Referring Physician: Linwood DibblesKNAPP, JON Physician/Extender: Tania AdeWeeks in Treatment: 5 Education Assessment Education Provided To: Patient Education Topics Provided Wound/Skin Impairment: Handouts: Other: Stay out of sun and limit exposure to water Methods: Explain/Verbal Responses: State content correctly Electronic Signature(s) Signed: 02/03/2016 2:18:02 PM By: Elliot GurneyWoody, RN, BSN, Kim RN, BSN Entered By: Elliot GurneyWoody, RN, BSN, Kim on 02/03/2016 14:00:51 Kimberly BinetWALKER, Kimberly N. ([id number]) -------------------------------------------------------------------------------- Wound Assessment Details Patient Name: Kimberly BinetWALKER, Kimberly N. 02/03/2016 1:30 Date of Service: PM Medical Record [id number] Number: Patient Account Number: [id number] 09-07-1980 (35 y.o. Treating RN: Huel CoventryWoody, Kim Date of Birth/Sex: Female) Other Clinician: Primary Care Physician: Treating Evlyn KannerBritto, Errol Referring Physician: Linwood DibblesKNAPP, JON Physician/Extender: Weeks in Treatment: 5 Wound Status Wound Number: 1 Primary Etiology: 2nd degree Burn Wound Location: Right Lower Leg Wound Status: Open Wounding Event: Thermal Burn Date Acquired: 12/21/2015 Weeks Of Treatment: 5 Clustered Wound: Yes Photos Wound Measurements Length: (cm) 0 % Reduction in Width: (cm) 0 % Reduction in Depth: (cm) 0 Epithelializati Area: (cm) 0 Tunneling: Volume: (cm) 0 Undermining: Area: 100% Volume: 100% on: Large (67-100%) No No Wound Description Full Thickness Without Exposed Classification: Support Structures Wound Margin: Flat and Intact Exudate None Present Amount: Foul Odor After Cleansing: No Wound Bed Granulation Amount: Large (67-100%) Exposed Structure Granulation Quality: Red, Pink Fascia Exposed: No Necrotic Amount: None Present (0%) Fat Layer Exposed: No Tendon Exposed: No Muscle Exposed: No Joint Exposed: No Borba, Linn N. ([id number]) Bone Exposed: No Limited to Skin Breakdown Periwound Skin  Texture Texture Color No Abnormalities Noted: No No Abnormalities Noted: No Callus: No Atrophie Blanche: No Crepitus: No Cyanosis: No Excoriation: No Ecchymosis: No Fluctuance: No Erythema: No Friable: No Hemosiderin Staining: No Induration: No Mottled: No Localized Edema: No Pallor: No Rash: No Rubor: No Scarring: Yes Temperature / Pain Moisture Temperature: No Abnormality No Abnormalities Noted: No Tenderness on Palpation: Yes Dry / Scaly: No Maceration: No Moist: Yes Wound Preparation Ulcer Cleansing: Other: cold water, Topical Anesthetic Applied: None Electronic Signature(s) Signed: 02/03/2016 2:18:02 PM By: Elliot GurneyWoody, RN, BSN, Kim RN, BSN Entered By: Elliot GurneyWoody, RN, BSN, Kim on 02/03/2016 13:58:09 Kimberly BinetWALKER, Kimberly N. ([id number]) -------------------------------------------------------------------------------- Wound Assessment Details Patient Name: Kimberly BinetWALKER, Kimberly N. 02/03/2016 1:30 Date of Service: PM Medical Record [id number] Number: Patient Account Number: [id number] 09-07-1980 (35 y.o. Treating RN: Huel CoventryWoody, Kim Date of Birth/Sex: Female) Other Clinician: Primary Care Physician: Treating Evlyn KannerBritto, Errol Referring Physician: Linwood DibblesKNAPP, JON Physician/Extender: Weeks in Treatment: 5 Wound Status Wound Number: 2 Primary Etiology: 2nd degree Burn Wound Location: Left Lower Leg Wound Status: Open Wounding Event: Thermal Burn Date Acquired: 12/21/2015 Weeks Of Treatment: 5 Clustered Wound: Yes Photos Wound Measurements Length: (cm) 0 % Reduction in Width: (  cm) 0 % Reduction in Depth: (cm) 0 Epithelializati Area: (cm) 0 Tunneling: Volume: (cm) 0 Undermining: Area: 100% Volume: 100% on: Large (67-100%) No No Wound Description Full Thickness Without Exposed Classification: Support Structures Wound Margin: Flat and Intact Exudate Large Amount: Exudate Type: Serosanguineous Exudate Color: red, brown Wound Bed Granulation Amount: None Present (0%) Exposed  Structure Necrotic Amount: None Present (0%) Fascia Exposed: No Fat Layer Exposed: No Tendon Exposed: No Culpepper, Atalya N. ([id number]) Muscle Exposed: No Joint Exposed: No Bone Exposed: No Limited to Skin Breakdown Periwound Skin Texture Texture Color No Abnormalities Noted: No No Abnormalities Noted: No Callus: No Atrophie Blanche: No Crepitus: No Cyanosis: No Excoriation: No Ecchymosis: No Fluctuance: No Erythema: No Friable: No Hemosiderin Staining: No Induration: No Mottled: No Localized Edema: No Pallor: No Rash: No Rubor: No Scarring: Yes Temperature / Pain Moisture Tenderness on Palpation: Yes No Abnormalities Noted: No Dry / Scaly: No Maceration: No Moist: No Wound Preparation Ulcer Cleansing: Other: cold water, Topical Anesthetic Applied: None Electronic Signature(s) Signed: 02/03/2016 2:18:02 PM By: Elliot GurneyWoody, RN, BSN, Kim RN, BSN Entered By: Elliot GurneyWoody, RN, BSN, Kim on 02/03/2016 13:58:49 Kimberly BinetWALKER, Gabriella N. ([id number]) -------------------------------------------------------------------------------- Vitals Details Patient Name: Kimberly BinetWALKER, Lenya N. 02/03/2016 1:30 Date of Service: PM Medical Record [id number] Number: Patient Account Number: [id number] 04-25-1980 (35 y.o. Treating RN: Huel CoventryWoody, Kim Date of Birth/Sex: Female) Other Clinician: Primary Care Physician: Treating Evlyn KannerBritto, Errol Referring Physician: Linwood DibblesKNAPP, JON Physician/Extender: Weeks in Treatment: 5 Vital Signs Time Taken: 13:42 Pulse (bpm): 72 Height (in): 68 Respiratory Rate (breaths/min): 16 Weight (lbs): 298 Blood Pressure (mmHg): 138/88 Body Mass Index (BMI): 45.3 Reference Range: 80 - 120 mg / dl Electronic Signature(s) Signed: 02/03/2016 2:18:02 PM By: Elliot GurneyWoody, RN, BSN, Kim RN, BSN Entered By: Elliot GurneyWoody, RN, BSN, Kim on 02/03/2016 13:42:59

## 2016-11-19 DIAGNOSIS — Z0000 Encounter for general adult medical examination without abnormal findings: Secondary | ICD-10-CM | POA: Diagnosis not present

## 2016-11-19 DIAGNOSIS — G4762 Sleep related leg cramps: Secondary | ICD-10-CM | POA: Diagnosis not present

## 2016-12-03 DIAGNOSIS — D72829 Elevated white blood cell count, unspecified: Secondary | ICD-10-CM | POA: Diagnosis not present

## 2017-01-30 DIAGNOSIS — B9689 Other specified bacterial agents as the cause of diseases classified elsewhere: Secondary | ICD-10-CM | POA: Diagnosis not present

## 2017-01-30 DIAGNOSIS — J019 Acute sinusitis, unspecified: Secondary | ICD-10-CM | POA: Diagnosis not present

## 2017-01-30 DIAGNOSIS — J209 Acute bronchitis, unspecified: Secondary | ICD-10-CM | POA: Diagnosis not present

## 2017-02-22 DIAGNOSIS — Z23 Encounter for immunization: Secondary | ICD-10-CM | POA: Diagnosis not present

## 2017-02-22 DIAGNOSIS — G4762 Sleep related leg cramps: Secondary | ICD-10-CM | POA: Diagnosis not present

## 2017-04-13 DIAGNOSIS — J029 Acute pharyngitis, unspecified: Secondary | ICD-10-CM | POA: Diagnosis not present

## 2017-11-16 DIAGNOSIS — Z0000 Encounter for general adult medical examination without abnormal findings: Secondary | ICD-10-CM | POA: Diagnosis not present

## 2017-11-23 DIAGNOSIS — Z0000 Encounter for general adult medical examination without abnormal findings: Secondary | ICD-10-CM | POA: Diagnosis not present

## 2017-11-23 DIAGNOSIS — G4762 Sleep related leg cramps: Secondary | ICD-10-CM | POA: Diagnosis not present

## 2018-01-06 DIAGNOSIS — M79672 Pain in left foot: Secondary | ICD-10-CM | POA: Diagnosis not present

## 2018-01-06 DIAGNOSIS — M79671 Pain in right foot: Secondary | ICD-10-CM | POA: Diagnosis not present

## 2018-01-06 DIAGNOSIS — M722 Plantar fascial fibromatosis: Secondary | ICD-10-CM | POA: Diagnosis not present

## 2018-02-03 DIAGNOSIS — M722 Plantar fascial fibromatosis: Secondary | ICD-10-CM | POA: Diagnosis not present

## 2019-01-30 NOTE — Progress Notes (Signed)
PCP:  Patient, No Pcp Per   Chief Complaint  Patient presents with  . Gynecologic Exam    flu shot     HPI:      [name] is a 38 y.o. No obstetric history on file. who LMP was Patient's last menstrual period was 01/10/2019 (exact date)., presents today for her NP > 3 yrs annual examination.  Her menses are regular every 28-30 days, lasting 14 days for past 6+ months (used to last 10 days). Has real flow for a few days, then stops bleeding for a couple days, then starts bleeding again, with 5 days dark spotting at end. Also notes horrible odor with bleeding. No odor if not bleeding. Has vag d/c if not bleeding, no irritation. Dysmenorrhea mild, improved with NSAIDs. She does not have intermenstrual bleeding. Had IUD in past with cycle control. No recent thyroid labs. Pt has lost wt in past few months but sx started before wt loss.   Sex activity: single partner, contraception - vasectomy.  Last Pap: June 24, 2015  Results were: no abnormalities /neg HPV DNA   There is a FH of breast cancer in her MGM. There is a FH of ovarian cancer in her MGM and mat aunt. Mat aunt is BRCA neg and her daughter/cousin is MyRisk neg. Pt is MyRisk neg 2017; IBIS=12%. The patient does not do self-breast exams.  Tobacco use: The patient denies current or previous tobacco use. Alcohol use: social drinker No drug use.  Exercise: not active  She does get adequate calcium but not Vitamin D in her diet. Labs with PCP. Takes celexa for depression and may need to change dose. Will f/u with PCP.   Has been having trouble falling asleep. Takes melatonin 3 mg with some help.  Also doing wt loss with phentermine, B12 and HCG injections. Has lost 50# wince 8/20.   Past Medical History:  Diagnosis Date  . BRCA negative    MyRisk neg 2017  . Family history of breast cancer 07/2015   IBIS=12%  . Family history of ovarian cancer    Fam is BRCA/BART neg. Pt is NEG BRCA1 and BRCA 2 from June 2012, cousin  of affected aunt is myrisk NEG  . Mild depression ([place])   . Vaccine for human papilloma virus (HPV) types 6, 11, 16, and 18 administered     History reviewed. No pertinent surgical history.  Family History  Problem Relation Age of Onset  . Ovarian cancer Maternal Aunt 50       BRCA/BART neg  . Breast cancer Maternal Grandmother 40       both breasts  . Ovarian cancer Maternal Grandmother   . Hypertension Mother   . Stroke Father   . Heart failure Father   . Hypertension Father   . Diabetes Father   . Heart failure Maternal Grandfather   . Heart failure Paternal Grandfather     Social History   Socioeconomic History  . Marital status: Single    Spouse name: Not on file  . Number of children: Not on file  . Years of education: Not on file  . Highest education level: Not on file  Occupational History  . Not on file  Social Needs  . Financial resource strain: Not on file  . Food insecurity    Worry: Not on file    Inability: Not on file  . Transportation needs    Medical: Not on file    Non-medical: Not  on file  Tobacco Use  . Smoking status: Never Smoker  . Smokeless tobacco: Never Used  Substance and Sexual Activity  . Alcohol use: Yes  . Drug use: Never  . Sexual activity: Yes    Birth control/protection: Surgical    Comment: Vasectomy  Lifestyle  . Physical activity    Days per week: Not on file    Minutes per session: Not on file  . Stress: Not on file  Relationships  . Social Herbalist on phone: Not on file    Gets together: Not on file    Attends religious service: Not on file    Active member of club or organization: Not on file    Attends meetings of clubs or organizations: Not on file    Relationship status: Not on file  . Intimate partner violence    Fear of current or ex partner: Not on file    Emotionally abused: Not on file    Physically abused: Not on file    Forced sexual activity: Not on file  Other Topics Concern  . Not on  file  Social History Narrative  . Not on file     Current Outpatient Medications:  .  citalopram (CELEXA) 40 MG tablet, take daily as directed, Disp: , Rfl:  .  naproxen (NAPROSYN) 500 MG tablet, Take 1 tablet (500 mg total) by mouth 2 (two) times daily., Disp: 30 tablet, Rfl: 0 .  phentermine 30 MG capsule, Take by mouth., Disp: , Rfl:      ROS:  Review of Systems  Constitutional: Negative for fatigue, fever and unexpected weight change.  Respiratory: Negative for cough, shortness of breath and wheezing.   Cardiovascular: Negative for chest pain, palpitations and leg swelling.  Gastrointestinal: Negative for blood in stool, constipation, diarrhea, nausea and vomiting.  Endocrine: Negative for cold intolerance, heat intolerance and polyuria.  Genitourinary: Positive for menstrual problem. Negative for dyspareunia, dysuria, flank pain, frequency, genital sores, hematuria, pelvic pain, urgency, vaginal bleeding, vaginal discharge and vaginal pain.  Musculoskeletal: Negative for back pain, joint swelling and myalgias.  Skin: Negative for rash.  Neurological: Negative for dizziness, syncope, light-headedness, numbness and headaches.  Hematological: Negative for adenopathy.  Psychiatric/Behavioral: Negative for agitation, confusion, sleep disturbance and suicidal ideas. The patient is not nervous/anxious.    BREAST: No symptoms   Objective: BP 140/90   Ht '5\' 8"'  (1.727 m)   Wt 243 lb (110.2 kg)   LMP 01/10/2019 (Exact Date)   BMI 36.95 kg/m    Physical Exam Constitutional:      Appearance: She is well-developed.  Genitourinary:     Vulva, vagina, cervix, uterus, right adnexa and left adnexa normal.     No vulval lesion or tenderness noted.     No vaginal discharge, erythema or tenderness.     No cervical polyp.     Uterus is not enlarged or tender.     No right or left adnexal mass present.     Right adnexa not tender.     Left adnexa not tender.  Neck:      Musculoskeletal: Normal range of motion.     Thyroid: No thyromegaly.  Cardiovascular:     Rate and Rhythm: Normal rate and regular rhythm.     Heart sounds: Normal heart sounds. No murmur.  Pulmonary:     Effort: Pulmonary effort is normal.     Breath sounds: Normal breath sounds.  Chest:     Breasts:  Right: No mass, nipple discharge, skin change or tenderness.        Left: No mass, nipple discharge, skin change or tenderness.  Abdominal:     Palpations: Abdomen is soft.     Tenderness: There is no abdominal tenderness. There is no guarding.  Musculoskeletal: Normal range of motion.  Neurological:     General: No focal deficit present.     Mental Status: She is alert and oriented to person, place, and time.     Cranial Nerves: No cranial nerve deficit.  Skin:    General: Skin is warm and dry.  Psychiatric:        Mood and Affect: Mood normal.        Behavior: Behavior normal.        Thought Content: Thought content normal.        Judgment: Judgment normal.  Vitals signs reviewed.     Results: Results for orders placed or performed in visit on 01/31/19 (from the past 24 hour(s))  POCT Wet Prep with KOH     Status: Normal   Collection Time: 01/31/19  4:06 PM  Result Value Ref Range   Trichomonas, UA Negative    Clue Cells Wet Prep HPF POC neg    Epithelial Wet Prep HPF POC     Yeast Wet Prep HPF POC neg    Bacteria Wet Prep HPF POC     RBC Wet Prep HPF POC     WBC Wet Prep HPF POC     KOH Prep POC Negative Negative    Assessment/Plan: Encounter for annual routine gynecological examination  Cervical cancer screening - Plan: [facility] P[facility]  Screening for HPV (human papillomavirus) - Plan: [facility] PAP  Family history of breast cancer and ovar cancer--Pt is MyRisk neg, IBIS=12%. Start mammos age 72.   Abnormal uterine bleeding (AUB) - Plan: TSH + free T4; For 6 months with 2 wk menses. Check thyroid. If neg, will check u/s. If neg, can discuss [facility] options for cycle control.    Thyroid disorder screening - Plan: TSH + free T4  Vaginal discharge - Plan: POCT Wet Prep with KOH, vaginal odor with menses. Neg wet prep.   Needs flu shot - Plan: Flu Vaccine QUAD 36+ mos IM (Fluarix, Quad PF)            GYN counsel adequate intake of calcium and vitamin D, diet and exercise     F/U  Return in about 1 year (around 01/31/2020).   B. , PA-C 01/31/2019 4:07 PM

## 2019-01-31 ENCOUNTER — Other Ambulatory Visit (HOSPITAL_COMMUNITY)
Admission: RE | Admit: 2019-01-31 | Discharge: 2019-01-31 | Disposition: A | Discharge status: Home / Self Care | Payer: 59 | Source: Ambulatory Visit | Attending: Obstetrics and Gynecology | Admitting: Obstetrics and Gynecology

## 2019-01-31 ENCOUNTER — Encounter: Payer: Self-pay | Admitting: Obstetrics and Gynecology

## 2019-01-31 ENCOUNTER — Ambulatory Visit (INDEPENDENT_AMBULATORY_CARE_PROVIDER_SITE_OTHER): Payer: 59 | Admitting: Obstetrics and Gynecology

## 2019-01-31 ENCOUNTER — Other Ambulatory Visit: Payer: Self-pay

## 2019-01-31 VITALS — BP 140/90 | Ht 68.0 in | Wt 243.0 lb

## 2019-01-31 DIAGNOSIS — Z124 Encounter for screening for malignant neoplasm of cervix: Secondary | ICD-10-CM

## 2019-01-31 DIAGNOSIS — Z23 Encounter for immunization: Secondary | ICD-10-CM

## 2019-01-31 DIAGNOSIS — N898 Other specified noninflammatory disorders of vagina: Secondary | ICD-10-CM | POA: Diagnosis not present

## 2019-01-31 DIAGNOSIS — Z01419 Encounter for gynecological examination (general) (routine) without abnormal findings: Secondary | ICD-10-CM

## 2019-01-31 DIAGNOSIS — Z1151 Encounter for screening for human papillomavirus (HPV): Secondary | ICD-10-CM | POA: Insufficient documentation

## 2019-01-31 DIAGNOSIS — N939 Abnormal uterine and vaginal bleeding, unspecified: Secondary | ICD-10-CM

## 2019-01-31 DIAGNOSIS — Z1329 Encounter for screening for other suspected endocrine disorder: Secondary | ICD-10-CM

## 2019-01-31 LAB — POCT WET PREP WITH KOH
Clue Cells Wet Prep HPF POC: NEGATIVE
KOH Prep POC: NEGATIVE
Trichomonas, UA: NEGATIVE
Yeast Wet Prep HPF POC: NEGATIVE

## 2019-01-31 NOTE — Patient Instructions (Signed)
I value your feedback and entrusting us with your care. If you get a [facility] patient survey, I would appreciate you taking the time to let us know about your experience today. Thank you! 

## 2019-02-01 ENCOUNTER — Telehealth: Payer: Self-pay | Admitting: Obstetrics and Gynecology

## 2019-02-01 DIAGNOSIS — N939 Abnormal uterine and vaginal bleeding, unspecified: Secondary | ICD-10-CM

## 2019-02-01 LAB — TSH+FREE T4
Free T4: 1 ng/dL (ref 0.82–1.77)
TSH: 1.34 u[IU]/mL (ref 0.450–4.500)

## 2019-02-01 NOTE — Telephone Encounter (Signed)
-----   Message from [place], [place] sent at 02/01/2019  8:46 AM EST ----- Pt agrees to have GYN u/s. Clarise Cruz can you pls call pt and schedule appt. ----- Message ----- From: Chad Cordial, PA-C Sent: [id number]/[phone]   8:17 AM EST To: Drenda Freeze, CMA  Pls let pt know thyroid labs normal. Needs GYN u/s.

## 2019-02-01 NOTE — Telephone Encounter (Signed)
Order placed, I"ll call pt with results. Thx.

## 2019-02-01 NOTE — Progress Notes (Signed)
Pls let pt know thyroid labs normal. Needs GYN u/s.

## 2019-02-01 NOTE — Telephone Encounter (Signed)
Patient is schedule for 02/07/19 for GYN u/s. Please place order. Thank you!

## 2019-02-02 LAB — CYTOLOGY - PAP
Comment: NEGATIVE
Diagnosis: NEGATIVE
High risk HPV: NEGATIVE

## 2019-02-07 ENCOUNTER — Other Ambulatory Visit: Payer: Self-pay

## 2019-02-07 ENCOUNTER — Ambulatory Visit (INDEPENDENT_AMBULATORY_CARE_PROVIDER_SITE_OTHER): Payer: 59

## 2019-02-07 DIAGNOSIS — N939 Abnormal uterine and vaginal bleeding, unspecified: Secondary | ICD-10-CM | POA: Diagnosis not present

## 2019-02-08 ENCOUNTER — Telehealth: Payer: Self-pay | Admitting: Obstetrics and Gynecology

## 2019-02-08 ENCOUNTER — Encounter: Payer: Self-pay | Admitting: Obstetrics and Gynecology

## 2019-02-08 MED ORDER — MICROGESTIN 24 FE 1-20 MG-MCG PO TABS: 1.0000 | ORAL_TABLET | Freq: Every day | ORAL | 3 refills | Status: DC

## 2019-02-08 NOTE — Telephone Encounter (Signed)
Pt aware of neg GYN u/s for long periods. Had neg thyroid labs and pap. Discussed OCPs, IUD, ablation vs watch and wait. Had IUD in past and did well, did OCPs in high school without probs. No hx of HTN, DVTs, migraines with aura. Pt to consider options and f/u prn.

## 2019-03-26 ENCOUNTER — Other Ambulatory Visit: Payer: Self-pay

## 2019-03-26 DIAGNOSIS — Z79899 Other long term (current) drug therapy: Secondary | ICD-10-CM | POA: Insufficient documentation

## 2019-03-26 DIAGNOSIS — N390 Urinary tract infection, site not specified: Secondary | ICD-10-CM | POA: Diagnosis not present

## 2019-03-26 DIAGNOSIS — R109 Unspecified abdominal pain: Secondary | ICD-10-CM | POA: Diagnosis present

## 2019-03-26 DIAGNOSIS — N200 Calculus of kidney: Secondary | ICD-10-CM | POA: Diagnosis not present

## 2019-03-26 LAB — CBC WITH DIFFERENTIAL/PLATELET
Abs Immature Granulocytes: 0.07 10*3/uL (ref 0.00–0.07)
Basophils Absolute: 0.1 10*3/uL (ref 0.0–0.1)
Basophils Relative: 0 %
Eosinophils Absolute: 0.1 10*3/uL (ref 0.0–0.5)
Eosinophils Relative: 1 %
HCT: 38 % (ref 36.0–46.0)
Hemoglobin: 13.1 g/dL (ref 12.0–15.0)
Immature Granulocytes: 1 %
Lymphocytes Relative: 20 %
Lymphs Abs: 2.6 10*3/uL (ref 0.7–4.0)
MCH: 26.6 pg (ref 26.0–34.0)
MCHC: 34.5 g/dL (ref 30.0–36.0)
MCV: 77.1 fL — ABNORMAL LOW (ref 80.0–100.0)
Monocytes Absolute: 0.5 10*3/uL (ref 0.1–1.0)
Monocytes Relative: 4 %
Neutro Abs: 9.7 10*3/uL — ABNORMAL HIGH (ref 1.7–7.7)
Neutrophils Relative %: 74 %
Platelets: 333 10*3/uL (ref 150–400)
RBC: 4.93 MIL/uL (ref 3.87–5.11)
RDW: 15.3 % (ref 11.5–15.5)
WBC: 13 10*3/uL — ABNORMAL HIGH (ref 4.0–10.5)
nRBC: 0 % (ref 0.0–0.2)

## 2019-03-26 LAB — BASIC METABOLIC PANEL
Anion gap: 10 (ref 5–15)
BUN: 17 mg/dL (ref 6–20)
CO2: 21 mmol/L — ABNORMAL LOW (ref 22–32)
Calcium: 9.1 mg/dL (ref 8.9–10.3)
Chloride: 103 mmol/L (ref 98–111)
Creatinine, Ser: 0.75 mg/dL (ref 0.44–1.00)
GFR calc Af Amer: >60 mL/min (ref >60)
GFR calc non Af Amer: >60 mL/min (ref >60)
Glucose, Bld: 166 mg/dL — ABNORMAL HIGH (ref 70–99)
Potassium: 3.9 mmol/L (ref 3.5–5.1)
Sodium: 134 mmol/L — ABNORMAL LOW (ref 135–145)

## 2019-03-26 NOTE — ED Triage Notes (Signed)
Pt reports L side lower abd pain that started suddenly tonight around 8:15pm. 10/10 pain. Pt also reports very slight RLQ abd pain as well. Denies n/v/d.  Pt took 500mg  Naproxen around 9:00pm

## 2019-03-27 ENCOUNTER — Emergency Department: Payer: 59

## 2019-03-27 ENCOUNTER — Emergency Department
Admission: EM | Admit: 2019-03-27 | Discharge: 2019-03-27 | Disposition: A | Discharge status: Home / Self Care | Payer: 59 | Attending: Emergency Medicine | Admitting: Emergency Medicine

## 2019-03-27 DIAGNOSIS — N200 Calculus of kidney: Secondary | ICD-10-CM

## 2019-03-27 DIAGNOSIS — N390 Urinary tract infection, site not specified: Secondary | ICD-10-CM

## 2019-03-27 LAB — URINALYSIS, ROUTINE W REFLEX MICROSCOPIC
Bilirubin Urine: NEGATIVE
Glucose, UA: NEGATIVE mg/dL
Ketones, ur: 20 mg/dL — AB
Nitrite: NEGATIVE
Protein, ur: 100 mg/dL — AB
RBC / HPF: >50 RBC/hpf — ABNORMAL HIGH (ref 0–5)
Specific Gravity, Urine: 1.024 (ref 1.005–1.030)
pH: 7 (ref 5.0–8.0)

## 2019-03-27 LAB — PREGNANCY, URINE: Preg Test, Ur: NEGATIVE

## 2019-03-27 MED ORDER — ONDANSETRON 4 MG PO TBDP: 4.0000 mg | ORAL_TABLET | Freq: Three times a day (TID) | ORAL | 0 refills | Status: DC | PRN

## 2019-03-27 MED ORDER — HYDROCODONE-ACETAMINOPHEN 5-325 MG PO TABS: 1.0000 | ORAL_TABLET | ORAL | 0 refills | Status: DC | PRN

## 2019-03-27 MED ORDER — SULFAMETHOXAZOLE-TRIMETHOPRIM 800-160 MG PO TABS: 1.0000 | ORAL_TABLET | Freq: Two times a day (BID) | ORAL | 0 refills | Status: DC

## 2019-03-27 NOTE — ED Notes (Signed)
See triage note.  States she developed left lateral abd pain last pm with positive nausea  States she also had some diff passing any urine    States while sitting in lobby she developed severe pain with urination  And now pain is gone

## 2019-03-27 NOTE — ED Provider Notes (Signed)
[facility] Emergency Department Provider Note  ____________________________________________   None    (approximate)  I have reviewed the triage vital signs and the nursing notes.   HISTORY  Chief Complaint Abdominal Pain   HPI Kimberly Hendricks is a 39 y.o. female presents to the ED with complaint of left lower abdominal pain that began initially with left flank pain.  Patient states that pain started suddenly at 8:15 PM and also experienced nausea.  Patient states she has never had a history of kidney stones and does not have any symptoms suggestive of UTI.  Patient denies any fever or chills.  Patient states that she believes she passed a stone while she was urinating prior to evaluation.  On her arrival to triage her pain was a 10 out of 10 but is decreased since that time.     Past Medical History:  Diagnosis Date  . BRCA negative    MyRisk neg 2017  . Family history of breast cancer 07/2015   IBIS=12%  . Family history of ovarian cancer    Fam is BRCA/BART neg. Pt is NEG BRCA1 and BRCA 2 from June 2012, cousin of affected aunt is myrisk NEG  . Mild depression ([place])   . Vaccine for human papilloma virus (HPV) types 6, 11, 16, and 18 administered     There are no problems to display for this patient.   History reviewed. No pertinent surgical history.  Prior to Admission medications   Medication Sig Start Date End Date Taking? Authorizing Provider  citalopram (CELEXA) 40 MG tablet take daily as directed 12/06/18   [provider]  HYDROcodone-acetaminophen (NORCO/VICODIN) 5-325 MG tablet Take 1 tablet by mouth every 4 (four) hours as needed for moderate pain. 03/27/19   Johnn Hai, PA-C  naproxen (NAPROSYN) 500 MG tablet Take 1 tablet (500 mg total) by mouth 2 (two) times daily. 12/21/15   Dorie Rank, MD  Norethindrone Acetate-Ethinyl Estrad-FE (MICROGESTIN 24 FE) 1-20 MG-MCG(24) tablet Take 1 tablet by mouth daily. 09/60/45   Copland,  Elmo Putt B, PA-C  ondansetron (ZOFRAN ODT) 4 MG disintegrating tablet Take 1 tablet (4 mg total) by mouth every 8 (eight) hours as needed for nausea or vomiting. 03/27/19   Johnn Hai, PA-C  phentermine 30 MG capsule Take by mouth.    [provider]  sulfamethoxazole-trimethoprim (BACTRIM DS) 800-160 MG tablet Take 1 tablet by mouth 2 (two) times daily. 03/27/19   Johnn Hai, PA-C    Allergies Patient has no known allergies.  Family History  Problem Relation Age of Onset  . Ovarian cancer Maternal Aunt 50       BRCA/BART neg  . Breast cancer Maternal Grandmother 40       both breasts  . Ovarian cancer Maternal Grandmother   . Hypertension Mother   . Stroke Father   . Heart failure Father   . Hypertension Father   . Diabetes Father   . Heart failure Maternal Grandfather   . Heart failure Paternal Grandfather     Social History Social History   Tobacco Use  . Smoking status: Never Smoker  . Smokeless tobacco: Never Used  Substance Use Topics  . Alcohol use: Yes  . Drug use: Never    Review of Systems Constitutional: No fever/chills Cardiovascular: Denies chest pain. Respiratory: Denies shortness of breath. Gastrointestinal: No abdominal pain.  No nausea, no vomiting.  Genitourinary: Negative for dysuria.  Positive for left flank pain, now resolved. Musculoskeletal:  Negative for back pain. Skin: Negative for rash. Neurological: Negative for headaches, focal weakness or numbness. ____________________________________________   PHYSICAL EXAM:  VITAL SIGNS: ED Triage Vitals  Enc Vitals Group     BP 03/26/19 2258 (!) 160/80     Pulse Rate 03/26/19 2258 60     Resp 03/26/19 2258 20     Temp 03/26/19 2258 97.7 F (36.5 C)     Temp Source 03/26/19 2258 Axillary     SpO2 03/26/19 2258 100 %     Weight 03/26/19 2259 230 lb (104.3 kg)     Height 03/26/19 2259 '5\' 8"'  (1.727 m)     Head Circumference --      Peak Flow --      Pain Score 03/26/19 2259  10     Pain Loc --      Pain Edu? --      Excl. in [place]? --     Constitutional: Alert and oriented. Well appearing and in no acute distress.  Patient states that his time of exam that she is not having any pain and feels as if the kidney stone has moved and that she is no longer having nausea or pain. Eyes: Conjunctivae are normal.  Head: Atraumatic. Neck: No stridor.   Cardiovascular: Normal rate, regular rhythm. Grossly normal heart sounds.  Good peripheral circulation. Respiratory: Normal respiratory effort.  No retractions. Lungs CTAB. Gastrointestinal: Soft and nontender.  Musculoskeletal: Moves upper and lower extremities with any difficulty.  Normal gait was noted. Neurologic:  Normal speech and language. No gross focal neurologic deficits are appreciated. No gait instability. Skin:  Skin is warm, dry and intact.  Psychiatric: Mood and affect are normal. Speech and behavior are normal.  ____________________________________________   LABS (all labs ordered are listed, but only abnormal results are displayed)  Labs Reviewed  CBC WITH DIFFERENTIAL/PLATELET - Abnormal; Notable for the following components:      Result Value   WBC 13.0 (*)    MCV 77.1 (*)    Neutro Abs 9.7 (*)    All other components within normal limits  BASIC METABOLIC PANEL - Abnormal; Notable for the following components:   Sodium 134 (*)    CO2 21 (*)    Glucose, Bld 166 (*)    All other components within normal limits  URINALYSIS, ROUTINE W REFLEX MICROSCOPIC - Abnormal; Notable for the following components:   Color, Urine AMBER (*)    APPearance CLOUDY (*)    Hgb urine dipstick LARGE (*)    Ketones, ur 20 (*)    Protein, ur 100 (*)    Leukocytes,Ua SMALL (*)    RBC / HPF >50 (*)    Bacteria, UA RARE (*)    All other components within normal limits  URINE CULTURE  PREGNANCY, URINE    RADIOLOGY  Official radiology report(s): CT Renal Stone Study  Result Date: 03/27/2019 CLINICAL DATA:   Left-sided abdominal pain EXAM: CT ABDOMEN AND PELVIS WITHOUT CONTRAST TECHNIQUE: Multidetector CT imaging of the abdomen and pelvis was performed following the standard protocol without IV contrast. COMPARISON:  None. FINDINGS: Lower chest: Lung bases demonstrate no acute consolidation or effusion. Heart size within normal limits. Hepatobiliary: No focal liver abnormality is seen. No gallstones, gallbladder wall thickening, or biliary dilatation. Pancreas: Unremarkable. No pancreatic ductal dilatation or surrounding inflammatory changes. Spleen: Normal in size without focal abnormality. Adrenals/Urinary Tract: Adrenal glands are normal. Multiple small stones in the left kidney, measuring up to 4 mm at the  midpole. Mild left hydronephrosis and hydroureter but no stone identified along the course of left ureter. Small 2 mm calcification in the posterior bladder. Stomach/Bowel: Stomach is within normal limits. Appendix appears normal. No evidence of bowel wall thickening, distention, or inflammatory changes. Vascular/Lymphatic: No significant vascular findings are present. No enlarged abdominal or pelvic lymph nodes. Reproductive: Uterus and bilateral adnexa are unremarkable. Other: No abdominal wall hernia or abnormality. No abdominopelvic ascites. Musculoskeletal: No acute or significant osseous findings. IMPRESSION: 1. Mild left hydronephrosis and hydroureter but no ureteral stone identified. Punctate 2 mm stone in the posterior bladder, collective findings likely reflect recently passed stone. 2. Multiple small stones within the left kidney Electronically Signed   By: Donavan Foil M.D.   On: 03/27/2019 02:04    ____________________________________________   PROCEDURES  Procedure(s) performed (including Critical Care):  Procedures   ____________________________________________   INITIAL IMPRESSION / ASSESSMENT AND PLAN / ED COURSE  As part of my medical decision making, I reviewed the following  data within the electronic MEDICAL RECORD NUMBER Notes from prior ED visits and [place] Controlled Substance Database  39 year old female presents to the ED with complaint of initial left flank pain that was felt down in the left groin area suddenly at approximately 8:15 PM yesterday.  Patient states that she rated her pain as 10/10 at that time.  She noted some nausea but no vomiting.  Patient took naproxen 500 mg prior to numbing to the emergency department.  CT scan confirms that most likely this was a renal stone that she experiences there is one seen in the bladder on the scan.  Patient urinalysis showed WBCs and RBCs and patient was made aware that she was also going to be placed on an antibiotic to take twice a day for the next 10 days.  She was given a prescription for Norco as needed for pain and Zofran as needed for nausea.  She is encouraged to increase fluids.  She will follow-up with her PCP if any continued problems or return to the emergency department if any severe worsening of her symptoms.  ____________________________________________   FINAL CLINICAL IMPRESSION(S) / ED DIAGNOSES  Final diagnoses:  Left renal stone  Acute urinary tract infection     ED Discharge Orders         Ordered    ondansetron (ZOFRAN ODT) 4 MG disintegrating tablet  Every 8 hours PRN     03/27/19 0731    HYDROcodone-acetaminophen (NORCO/VICODIN) 5-325 MG tablet  Every 4 hours PRN     03/27/19 0731    sulfamethoxazole-trimethoprim (BACTRIM DS) 800-160 MG tablet  2 times daily     03/27/19 0731           Note:  This document was prepared using Dragon voice recognition software and may include unintentional dictation errors.    Johnn Hai, PA-C 03/27/19 1617    Arta Silence, MD 04/02/19 [phone]

## 2019-03-27 NOTE — Discharge Instructions (Addendum)
Follow-up with your primary care provider if any continued problems.  Return to the emergency department if any severe worsening of your symptoms.  Increase fluids.  Take medication only as needed.  A prescription for Norco was sent to your pharmacy if needed for pain, Zofran if needed for nausea and Bactrim DS twice daily for 10 days because you also have a urinary tract infection which most likely was due to the kidney stone.  Do not take pain medication and drive or operate machinery.

## 2019-03-28 LAB — URINE CULTURE

## 2020-01-30 ENCOUNTER — Other Ambulatory Visit: Payer: Self-pay

## 2020-01-30 MED ORDER — MICROGESTIN 24 FE 1-20 MG-MCG PO TABS: 1.0000 | ORAL_TABLET | Freq: Every day | ORAL | 0 refills | Status: DC

## 2020-01-30 NOTE — Telephone Encounter (Signed)
Pt calling; needs refill of bc sent to [facility].  [phone] aware refill eRx'd.

## 2020-02-29 ENCOUNTER — Ambulatory Visit: Payer: 59 | Admitting: Obstetrics and Gynecology

## 2020-03-27 NOTE — Progress Notes (Signed)
 PCP:  Patient, No Pcp Per   Chief Complaint  Patient presents with  . Annual Exam     HPI:      [name] is a 40 y.o. No obstetric history on file. who LMP was Patient's last menstrual period was 03/04/2020., presents today for her annual examination.  Her menses are regular every 28-30 days, lasting 4-6 days now (14 days last yr) on OCPs, lighter flow from last yr, Dysmenorrhea mild, improved with NSAIDs. She does not have intermenstrual bleeding. Had neg labs/GYN u/s last yr for menorrhagia. Started OCPs with sx improvement, wants to continue. Had IUD in past with cycle control.   Sex activity: single partner, contraception - vasectomy.  Last Pap: 01/31/19  Results were: no abnormalities /neg HPV DNA   There is a FH of breast cancer in her MGM. There is a FH of ovarian cancer in her MGM and mat aunt. Mat aunt is BRCA neg and her daughter/cousin is MyRisk neg. Pt is MyRisk neg 2017; IBIS=12%. The patient does not do self-breast exams.  Tobacco use: The patient denies current or previous tobacco use. Alcohol use: social drinker No drug use.  Exercise: not active  She does get adequate calcium but not Vitamin D in her diet. Labs with PCP.  Seeing psych for anxiety/depression; trying to get right Rx combo.    Past Medical History:  Diagnosis Date  . BRCA negative    MyRisk neg 2017  . Family history of breast cancer 07/2015   IBIS=12%  . Family history of ovarian cancer    Fam is BRCA/BART neg. Pt is NEG BRCA1 and BRCA 2 from June 2012, cousin of affected aunt is myrisk NEG  . Mild depression ([facility])   . Vaccine for human papilloma virus (HPV) types 6, 11, 16, and 18 administered     History reviewed. No pertinent surgical history.  Family History  Problem Relation Age of Onset  . Ovarian cancer Maternal Aunt 50       BRCA/BART neg  . Breast cancer Maternal Grandmother 40       both breasts  . Ovarian cancer Maternal Grandmother   . Hypertension Mother   .  Stroke Father   . Heart failure Father   . Hypertension Father   . Diabetes Father   . Heart failure Maternal Grandfather   . Heart failure Paternal Grandfather     Social History   Socioeconomic History  . Marital status: Single    Spouse name: Not on file  . Number of children: Not on file  . Years of education: Not on file  . Highest education level: Not on file  Occupational History  . Not on file  Tobacco Use  . Smoking status: Never Smoker  . Smokeless tobacco: Never Used  Vaping Use  . Vaping Use: Never used  Substance and Sexual Activity  . Alcohol use: Yes  . Drug use: Never  . Sexual activity: Yes    Birth control/protection: Surgical    Comment: Vasectomy  Other Topics Concern  . Not on file  Social History Narrative  . Not on file   Social Determinants of Health   Financial Resource Strain: Not on file  Food Insecurity: Not on file  Transportation Needs: Not on file  Physical Activity: Not on file  Stress: Not on file  Social Connections: Not on file  Intimate Partner Violence: Not on file     Current Outpatient Medications:  .  HYDROcodone-acetaminophen (  NORCO/VICODIN) 5-325 MG tablet, Take 1 tablet by mouth every 4 (four) hours as needed for moderate pain., Disp: 12 tablet, Rfl: 0 .  naproxen (NAPROSYN) 500 MG tablet, Take 1 tablet (500 mg total) by mouth 2 (two) times daily. (Patient taking differently: Take 500 mg by mouth as needed.), Disp: 30 tablet, Rfl: 0 .  buPROPion (WELLBUTRIN XL) 300 MG 24 hr tablet, Take 300 mg by mouth every morning., Disp: , Rfl:  .  hydrOXYzine (ATARAX/VISTARIL) 25 MG tablet, Take 25-50 mg by mouth at bedtime., Disp: , Rfl:  .  Norethindrone Acetate-Ethinyl Estrad-FE (MICROGESTIN 24 FE) 1-20 MG-MCG(24) tablet, Take 1 tablet by mouth daily., Disp: 84 tablet, Rfl: 3 .  phentermine 30 MG capsule, Take by mouth. (Patient not taking: Reported on 03/28/2020), Disp: , Rfl:  .  VIIBRYD 20 MG TABS, Take 1 tablet by mouth daily.,  Disp: , Rfl:      ROS:  Review of Systems  Constitutional: Negative for fatigue, fever and unexpected weight change.  Respiratory: Negative for cough, shortness of breath and wheezing.   Cardiovascular: Negative for chest pain, palpitations and leg swelling.  Gastrointestinal: Negative for blood in stool, constipation, diarrhea, nausea and vomiting.  Endocrine: Negative for cold intolerance, heat intolerance and polyuria.  Genitourinary: Negative for dyspareunia, dysuria, flank pain, frequency, genital sores, hematuria, menstrual problem, pelvic pain, urgency, vaginal bleeding, vaginal discharge and vaginal pain.  Musculoskeletal: Negative for back pain, joint swelling and myalgias.  Skin: Negative for rash.  Neurological: Negative for dizziness, syncope, light-headedness, numbness and headaches.  Hematological: Negative for adenopathy.  Psychiatric/Behavioral: Positive for agitation and dysphoric mood. Negative for confusion, sleep disturbance and suicidal ideas. The patient is not nervous/anxious.    BREAST: No symptoms   Objective: BP 126/82   Pulse 79   Temp 98.4 F (36.9 C) (Oral)   Ht 5' 8" (1.727 m)   Wt 263 lb (119.3 kg)   LMP 03/04/2020   SpO2 97%   BMI 39.99 kg/m    Physical Exam Constitutional:      Appearance: She is well-developed.  Genitourinary:     Vulva normal.     Right Labia: No rash, tenderness or lesions.    Left Labia: No tenderness, lesions or rash.    No vaginal discharge, erythema or tenderness.      Right Adnexa: not tender and no mass present.    Left Adnexa: not tender and no mass present.    No cervical friability or polyp.     Uterus is not enlarged or tender.  Breasts:     Right: No mass, nipple discharge, skin change or tenderness.     Left: No mass, nipple discharge, skin change or tenderness.    Neck:     Thyroid: No thyromegaly.  Cardiovascular:     Rate and Rhythm: Normal rate and regular rhythm.     Heart sounds: Normal  heart sounds. No murmur heard.   Pulmonary:     Effort: Pulmonary effort is normal.     Breath sounds: Normal breath sounds.  Abdominal:     Palpations: Abdomen is soft.     Tenderness: There is no abdominal tenderness. There is no guarding or rebound.  Musculoskeletal:        General: Normal range of motion.     Cervical back: Normal range of motion.  Lymphadenopathy:     Cervical: No cervical adenopathy.  Neurological:     General: No focal deficit present.     Mental Status:   She is alert and oriented to person, place, and time.     Cranial Nerves: No cranial nerve deficit.  Skin:    General: Skin is warm and dry.  Psychiatric:        Mood and Affect: Mood normal.        Behavior: Behavior normal.        Thought Content: Thought content normal.        Judgment: Judgment normal.  Vitals reviewed.     Assessment/Plan: Encounter for annual routine gynecological examination  Encounter for surveillance of contraceptive pills - Plan: Norethindrone Acetate-Ethinyl Estrad-FE (MICROGESTIN 24 FE) 1-20 MG-MCG(24) tablet; OCP RF. Doing well. F/u prn.   Meds ordered this encounter  Medications  . Norethindrone Acetate-Ethinyl Estrad-FE (MICROGESTIN 24 FE) 1-20 MG-MCG(24) tablet    Sig: Take 1 tablet by mouth daily.    Dispense:  84 tablet    Refill:  3    Order Specific Question:   Supervising Provider    Answer:   Gae Dry [[id number]]              GYN counsel adequate intake of calcium and vitamin D, diet and exercise     F/U  Return in about 1 year (around 03/28/2021).  Rane Blitch B. Laurrie Toppin, PA-C 03/28/2020 9:17 AM

## 2020-03-28 ENCOUNTER — Ambulatory Visit (INDEPENDENT_AMBULATORY_CARE_PROVIDER_SITE_OTHER): Payer: 59 | Admitting: Obstetrics and Gynecology

## 2020-03-28 ENCOUNTER — Other Ambulatory Visit: Payer: Self-pay

## 2020-03-28 ENCOUNTER — Encounter: Payer: Self-pay | Admitting: Obstetrics and Gynecology

## 2020-03-28 VITALS — BP 126/82 | HR 79 | Temp 98.4°F | Ht 68.0 in | Wt 263.0 lb

## 2020-03-28 DIAGNOSIS — Z01419 Encounter for gynecological examination (general) (routine) without abnormal findings: Secondary | ICD-10-CM | POA: Diagnosis not present

## 2020-03-28 DIAGNOSIS — Z3041 Encounter for surveillance of contraceptive pills: Secondary | ICD-10-CM | POA: Diagnosis not present

## 2020-03-28 MED ORDER — MICROGESTIN 24 FE 1-20 MG-MCG PO TABS: 1.0000 | ORAL_TABLET | Freq: Every day | ORAL | 3 refills | Status: DC

## 2020-03-28 NOTE — Patient Instructions (Signed)
I value your feedback and you entrusting us with your care. If you get a [facility] [place] patient survey, I would appreciate you taking the time to let us know about your experience today. Thank you! ? ? ?

## 2020-12-23 ENCOUNTER — Other Ambulatory Visit: Payer: Self-pay | Admitting: Obstetrics and Gynecology

## 2020-12-23 DIAGNOSIS — Z3041 Encounter for surveillance of contraceptive pills: Secondary | ICD-10-CM

## 2020-12-24 ENCOUNTER — Encounter: Payer: Self-pay | Admitting: Obstetrics and Gynecology

## 2020-12-24 NOTE — Telephone Encounter (Signed)
This encounter was created in error - please disregard.

## 2020-12-25 ENCOUNTER — Ambulatory Visit
Admission: RE | Admit: 2020-12-25 | Discharge: 2020-12-25 | Disposition: A | Discharge status: Home / Self Care | Payer: 59 | Source: Ambulatory Visit | Attending: Obstetrics and Gynecology | Admitting: Obstetrics and Gynecology

## 2020-12-25 ENCOUNTER — Encounter: Payer: Self-pay | Admitting: Obstetrics and Gynecology

## 2020-12-25 ENCOUNTER — Ambulatory Visit: Payer: 59

## 2020-12-25 ENCOUNTER — Ambulatory Visit: Payer: 59 | Admitting: Obstetrics and Gynecology

## 2020-12-25 ENCOUNTER — Other Ambulatory Visit: Payer: Self-pay

## 2020-12-25 VITALS — BP 134/76 | Ht 67.0 in | Wt 238.0 lb

## 2020-12-25 DIAGNOSIS — R1032 Left lower quadrant pain: Secondary | ICD-10-CM | POA: Diagnosis not present

## 2020-12-25 DIAGNOSIS — R109 Unspecified abdominal pain: Secondary | ICD-10-CM

## 2020-12-25 DIAGNOSIS — N200 Calculus of kidney: Secondary | ICD-10-CM | POA: Insufficient documentation

## 2020-12-25 LAB — POCT URINALYSIS DIPSTICK
Bilirubin, UA: NEGATIVE
Glucose, UA: NEGATIVE
Ketones, UA: NEGATIVE
Nitrite, UA: NEGATIVE
Protein, UA: NEGATIVE
Spec Grav, UA: 1.015 (ref 1.010–1.025)
pH, UA: 6.5 (ref 5.0–8.0)

## 2020-12-25 LAB — POCT URINE PREGNANCY: Preg Test, Ur: NEGATIVE

## 2020-12-25 MED ORDER — IBUPROFEN 800 MG PO TABS: 800.0000 mg | ORAL_TABLET | Freq: Three times a day (TID) | ORAL | 0 refills | Status: DC | PRN

## 2020-12-25 NOTE — Progress Notes (Signed)
Kimberly Hector, MD   Chief Complaint  Patient presents with   Pelvic Pain    Left side only on/off x 2 months, denies UTI sx    HPI:      [name] is a 40 y.o. [id number] whose LMP was No LMP recorded., presents today for LLQ pain and LT flank pain, worse for past 3 days. Unable to eat due to pain, not getting much sleep due to pain/can't get comfortable. Has had n/v with pain. Pain is LT flank and throbbing LLQ, intermittent. Not relieved with tylenol, heating pad or leftover hydrocodone. Has had decreased urine flow and chills. Pain at end of urination. No hematuria, no vag sx. Hx of kidney stones in past and multiple stones in LT kidney 1/21 on CT renal study. Stones passed, never saw urology.  Had similar sx, more LT flank 8/22 and 9/22 and saw PCP. Had blood on UA with crystals 8/22, no crystals 9/22. Treated for UTI but C&S neg. Sx resolved 8/22, recurred 12/10/20 and then again 3 days ago.  Her menses are regular every 28-30 days, lasting a few days now on OCPs, light flow. LMP was really light for 1 1/2 days.  She is sex active, although no recently. No pain/bleeding.    Past Medical History:  Diagnosis Date   BRCA negative    MyRisk neg 2017   Family history of breast cancer 07/2015   IBIS=12%   Family history of ovarian cancer    Fam is BRCA/BART neg. Pt is NEG BRCA1 and BRCA 2 from June 2012, cousin of affected aunt is myrisk NEG   Mild depression    Vaccine for human papilloma virus (HPV) types 6, 11, 16, and 18 administered     History reviewed. No pertinent surgical history.  Family History  Problem Relation Age of Onset   Ovarian cancer Maternal Aunt 35       BRCA/BART neg   Breast cancer Maternal Grandmother 57       both breasts   Ovarian cancer Maternal Grandmother    Hypertension Mother    Stroke Father    Heart failure Father    Hypertension Father    Diabetes Father    Heart failure Maternal Grandfather    Heart failure Paternal  Grandfather     Social History   Socioeconomic History   Marital status: Single    Spouse name: Not on file   Number of children: Not on file   Years of education: Not on file   Highest education level: Not on file  Occupational History   Not on file  Tobacco Use   Smoking status: Never   Smokeless tobacco: Never  Vaping Use   Vaping Use: Never used  Substance and Sexual Activity   Alcohol use: Yes   Drug use: Never   Sexual activity: Yes    Birth control/protection: Surgical, Pill    Comment: Vasectomy  Other Topics Concern   Not on file  Social History Narrative   Not on file   Social Determinants of Health   Financial Resource Strain: Not on file  Food Insecurity: Not on file  Transportation Needs: Not on file  Physical Activity: Not on file  Stress: Not on file  Social Connections: Not on file  Intimate Partner Violence: Not on file    Outpatient Medications Prior to Visit  Medication Sig Dispense Refill   buPROPion (WELLBUTRIN XL) 300 MG 24 hr tablet Take 300  mg by mouth every morning.     CONCERTA 36 MG CR tablet Take 36 mg by mouth every morning.     hydrOXYzine (ATARAX/VISTARIL) 25 MG tablet Take 25-50 mg by mouth at bedtime.     naproxen (NAPROSYN) 500 MG tablet Take 1 tablet (500 mg total) by mouth 2 (two) times daily. (Patient taking differently: Take 500 mg by mouth as needed.) 30 tablet 0   Norethindrone Acetate-Ethinyl Estrad-FE (MICROGESTIN 24 FE) 1-20 MG-MCG(24) tablet Take 1 tablet by mouth daily. 84 tablet 3   topiramate (TOPAMAX) 50 MG tablet Take 50 mg by mouth daily.     VIIBRYD 20 MG TABS Take 1 tablet by mouth daily.     HYDROcodone-acetaminophen (NORCO/VICODIN) 5-325 MG tablet Take 1 tablet by mouth every 4 (four) hours as needed for moderate pain. 12 tablet 0   phentermine 30 MG capsule Take by mouth. (Patient not taking: Reported on 03/28/2020)     No facility-administered medications prior to visit.      ROS:  Review of Systems   Constitutional:  Negative for fever.  Gastrointestinal:  Negative for blood in stool, constipation, diarrhea, nausea and vomiting.  Genitourinary:  Positive for difficulty urinating, dysuria, frequency and pelvic pain. Negative for dyspareunia, flank pain, hematuria, urgency, vaginal bleeding, vaginal discharge and vaginal pain.  Musculoskeletal:  Positive for back pain.  Skin:  Negative for rash.  BREAST: No symptoms   OBJECTIVE:   Vitals:  BP 134/76   Ht '5\' 7"'  (1.702 m)   Wt 238 lb (108 kg)   BMI 37.28 kg/m   Physical Exam Vitals reviewed.  Constitutional:      Appearance: She is well-developed. She is not ill-appearing or toxic-appearing.  Pulmonary:     Effort: Pulmonary effort is normal.  Abdominal:     Palpations: Abdomen is soft.     Tenderness: There is no abdominal tenderness. There is left CVA tenderness. There is no right CVA tenderness.  Genitourinary:    General: Normal vulva.     Pubic Area: No rash.      Labia:        Right: No rash, tenderness or lesion.        Left: No rash, tenderness or lesion.      Vagina: Normal. No vaginal discharge, erythema or tenderness.     Cervix: Normal.     Uterus: Normal. Not enlarged and not tender.      Adnexa: Right adnexa normal.       Right: No mass or tenderness.         Left: Tenderness present. No mass.    Musculoskeletal:        General: Normal range of motion.     Cervical back: Normal range of motion.  Skin:    General: Skin is warm and dry.  Neurological:     General: No focal deficit present.     Mental Status: She is alert and oriented to person, place, and time.     Cranial Nerves: No cranial nerve deficit.  Psychiatric:        Mood and Affect: Mood normal.        Behavior: Behavior normal.        Thought Content: Thought content normal.        Judgment: Judgment normal.    Results: Results for orders placed or performed in visit on 12/25/20 (from the past 24 hour(s))  POCT Urinalysis Dipstick      Status: Abnormal   Collection Time:  12/25/20  4:58 PM  Result Value Ref Range   Color, UA yellow    Clarity, UA clear    Glucose, UA Negative Negative   Bilirubin, UA neg    Ketones, UA neg    Spec Grav, UA 1.015 1.010 - 1.025   Blood, UA mod    pH, UA 6.5 5.0 - 8.0   Protein, UA Negative Negative   Urobilinogen, UA     Nitrite, UA neg    Leukocytes, UA Small (1+) (A) Negative   Appearance     Odor    POCT urine pregnancy     Status: Normal   Collection Time: 12/25/20  4:59 PM  Result Value Ref Range   Preg Test, Ur Negative Negative     Assessment/Plan: Left lower quadrant abdominal pain - Plan: POCT urine pregnancy, CT RENAL STONE STUDY; neg UPT. Most likely due to kidney stones given sx, hx. Check CT renal. If neg, will check GYN u/s. Check C&S, CMP. Will f/u with results.   Left flank pain - Plan: POCT Urinalysis Dipstick, Comprehensive metabolic panel, Urine Culture, CT RENAL STONE STUDY, ibuprofen (ADVIL) 800 MG tablet; Check labs/imaging. Rx motrin in meantime/heating pad. Go to ED if unbearable.   Kidney stone - Plan: Comprehensive metabolic panel, Urine Culture, CT RENAL STONE STUDY, ibuprofen (ADVIL) 800 MG tablet    Meds ordered this encounter  Medications   ibuprofen (ADVIL) 800 MG tablet    Sig: Take 1 tablet (800 mg total) by mouth every 8 (eight) hours as needed.    Dispense:  30 tablet    Refill:  0    Order Specific Question:   Supervising Provider    Answer:   Gae Dry [[id number]]      Return if symptoms worsen or fail to improve.  Tajanay Hurley B. Neyda Durango, PA-C 12/25/2020 5:03 PM

## 2020-12-26 ENCOUNTER — Telehealth: Payer: Self-pay | Admitting: Obstetrics and Gynecology

## 2020-12-26 ENCOUNTER — Encounter: Payer: Self-pay | Admitting: Urology

## 2020-12-26 ENCOUNTER — Other Ambulatory Visit: Payer: Self-pay | Admitting: Urology

## 2020-12-26 ENCOUNTER — Ambulatory Visit: Payer: 59 | Admitting: Urology

## 2020-12-26 VITALS — BP 132/82 | HR 73 | Ht 67.0 in | Wt 234.0 lb

## 2020-12-26 DIAGNOSIS — N201 Calculus of ureter: Secondary | ICD-10-CM | POA: Diagnosis not present

## 2020-12-26 LAB — COMPREHENSIVE METABOLIC PANEL
ALT: 9 IU/L (ref 0–32)
AST: 12 IU/L (ref 0–40)
Albumin/Globulin Ratio: 1.4 (ref 1.2–2.2)
Albumin: 4 g/dL (ref 3.8–4.8)
Alkaline Phosphatase: 82 IU/L (ref 44–121)
BUN/Creatinine Ratio: 9 (ref 9–23)
BUN: 11 mg/dL (ref 6–20)
Bilirubin Total: 0.6 mg/dL (ref 0.0–1.2)
CO2: 20 mmol/L (ref 20–29)
Calcium: 8.9 mg/dL (ref 8.7–10.2)
Chloride: 105 mmol/L (ref 96–106)
Creatinine, Ser: 1.21 mg/dL — ABNORMAL HIGH (ref 0.57–1.00)
Globulin, Total: 2.9 g/dL (ref 1.5–4.5)
Glucose: 81 mg/dL (ref 70–99)
Potassium: 3.6 mmol/L (ref 3.5–5.2)
Sodium: 138 mmol/L (ref 134–144)
Total Protein: 6.9 g/dL (ref 6.0–8.5)
eGFR: 59 mL/min/{1.73_m2} — ABNORMAL LOW (ref >59)

## 2020-12-26 MED ORDER — TAMSULOSIN HCL 0.4 MG PO CAPS: 0.4000 mg | ORAL_CAPSULE | Freq: Every day | ORAL | 0 refills | Status: DC

## 2020-12-26 MED ORDER — KETOROLAC TROMETHAMINE 10 MG PO TABS: 10.0000 mg | ORAL_TABLET | Freq: Four times a day (QID) | ORAL | 0 refills | Status: DC | PRN

## 2020-12-26 NOTE — Patient Instructions (Addendum)
Laser Therapy for Kidney Stones Laser therapy for kidney stones is a procedure to break up small, hard mineral deposits that form in the kidney (kidney stones). The procedure is done using a device that produces a focused beam of light (laser). The laser breaks up kidney stones into pieces that are small enough to be passed out of the body through urination or removed from the body during the procedure. You may need laser therapy if you have kidney stones that are painful or block your urinary tract. This procedure is done by inserting a tube (ureteroscope) into your kidney through the urethral opening. The urethra is the part of the body that drains urine from the bladder. In women, the urethra opens above the vaginal opening. In men, the urethra opens at the tip of the penis. The ureteroscope is inserted through the urethra, and surgical instruments are moved through the bladder and the muscular tube that connects the kidney to the bladder (ureter) until they reach the kidney. Tell a health care provider about: Any allergies you have. All medicines you are taking, including vitamins, herbs, eye drops, creams, and over-the-counter medicines. Any problems you or family members have had with anesthetic medicines. Any blood disorders you have. Any surgeries you have had. Any medical conditions you have. Whether you are pregnant or may be pregnant. What are the risks? Generally, this is a safe procedure. However, problems may occur, including: Infection. Bleeding. Allergic reactions to medicines. Damage to the urethra, bladder, or ureter. Urinary tract infection (UTI). Narrowing of the urethra (urethral stricture). Difficulty passing urine. Blockage of the kidney caused by a fragment of kidney stone. What happens before the procedure? Medicines Ask your health care provider about: Changing or stopping your regular medicines. This is especially important if you are taking diabetes medicines or  blood thinners. Taking medicines such as aspirin and ibuprofen. These medicines can thin your blood. Do not take these medicines unless your health care provider tells you to take them. Taking over-the-counter medicines, vitamins, herbs, and supplements. Eating and drinking Follow instructions from your health care provider about eating and drinking, which may include: 8 hours before the procedure - stop eating heavy meals or foods, such as meat, fried foods, or fatty foods. 6 hours before the procedure - stop eating light meals or foods, such as toast or cereal. 6 hours before the procedure - stop drinking milk or drinks that contain milk. 2 hours before the procedure - stop drinking clear liquids. Staying hydrated Follow instructions from your health care provider about hydration, which may include: Up to 2 hours before the procedure - you may continue to drink clear liquids, such as water, clear fruit juice, black coffee, and plain tea.  General instructions You may have a physical exam before the procedure. You may also have tests, such as imaging tests and blood or urine tests. If your ureter is too narrow, your health care provider may place a soft, flexible tube (stent) inside of it. The stent may be placed days or weeks before your laser therapy procedure. Plan to have someone take you home from the hospital or clinic. If you will be going home right after the procedure, plan to have someone stay with you for 24 hours. Do not use any products that contain nicotine or tobacco for at least 4 weeks before the procedure. These products include cigarettes, e-cigarettes, and chewing tobacco. If you need help quitting, ask your health care provider. Ask your health care provider: How your   surgical site will be marked or identified. What steps will be taken to help prevent infection. These may include: Removing hair at the surgery site. Washing skin with a germ-killing soap. Taking antibiotic  medicine. What happens during the procedure?  An IV will be inserted into one of your veins. You will be given one or more of the following: A medicine to help you relax (sedative). A medicine to numb the area (local anesthetic). A medicine to make you fall asleep (general anesthetic). A ureteroscope will be inserted into your urethra. The ureteroscope will send images to a video screen in the operating room to guide your surgeon to the area of your kidney that will be treated. A small, flexible tube will be threaded through the ureteroscope and into your bladder and ureter, up to your kidney. The laser device will be inserted into your kidney through the tube. Your surgeon will pulse the laser on and off to break up kidney stones. A surgical instrument that has a tiny wire basket may be inserted through the tube into your kidney to remove the pieces of broken kidney stone. The procedure may vary among health care providers and hospitals. What happens after the procedure? Your blood pressure, heart rate, breathing rate, and blood oxygen level will be monitored until you leave the hospital or clinic. You will be given pain medicine as needed. You may continue to receive antibiotics. You may have a stent temporarily placed in your ureter. Do not drive for 24 hours if you were given a sedative during your procedure. You may be given a strainer to collect any stone fragments that you pass in your urine. Your health care provider may have these tested. Summary Laser therapy for kidney stones is a procedure to break up kidney stones into pieces that are small enough to be passed out of the body through urination or removed during the procedure. Follow instructions from your health care provider about eating and drinking before the procedure. During the procedure, the ureteroscope will send images to a video screen to guide your surgeon to the area of your kidney that will be treated. Do not drive  for 24 hours if you were given a sedative during your procedure. This information is not intended to replace advice given to you by your health care provider. Make sure you discuss any questions you have with your health care provider. Document Revised: 11/18/2017 Document Reviewed: 11/18/2017 Elsevier Patient Education  2022 [employer]   Ureteral Stent Implantation Ureteral stent implantation is a procedure to insert (implant) a flexible, soft, plastic tube (stent) into a ureter. Ureters are the tube-like parts of the body that drain urine from the kidneys. The stent supports the ureter while it heals and helps to drain urine. You may have a ureteral stent implanted after having a procedure to remove a blockage from the ureter (ureterolysis or pyeloplasty). You may also have a stent implanted to open the flow of urine when you have a blockage caused by a kidney stone, tumor, blood clot, or infection. You have two ureters, one on each side of the body. The ureters connect the kidneys to the organ that holds urine until it passes out of the body (bladder). The stent is placed so that one end is in the kidney, and one end is in the bladder. The stent is usually taken out after your ureter has healed. Depending on your condition, you may have a stent for just a few weeks, or you may  have a long-term stent that will need to be replaced every few months. Tell a health care provider about: Any allergies you have. All medicines you are taking, including vitamins, herbs, eye drops, creams, and over-the-counter medicines. Any problems you or family members have had with anesthetic medicines. Any blood disorders you have. Any surgeries you have had. Any medical conditions you have. Whether you are pregnant or may be pregnant. What are the risks? Generally, this is a safe procedure. However, problems may occur, including: Infection. Bleeding. Allergic reactions to medicines. Damage to other structures  or organs. Tearing (perforation) of the ureter is possible. Movement of the stent away from where it is placed during surgery (migration). What happens before the procedure? Medicines Ask your health care provider about: Changing or stopping your regular medicines. This is especially important if you are taking diabetes medicines or blood thinners. Taking medicines such as aspirin and ibuprofen. These medicines can thin your blood. Do not take these medicines unless your health care provider tells you to take them. Taking over-the-counter medicines, vitamins, herbs, and supplements. Eating and drinking Follow instructions from your health care provider about eating and drinking, which may include: 8 hours before the procedure - stop eating heavy meals or foods, such as meat, fried foods, or fatty foods. 6 hours before the procedure - stop eating light meals or foods, such as toast or cereal. 6 hours before the procedure - stop drinking milk or drinks that contain milk. 2 hours before the procedure - stop drinking clear liquids. Staying hydrated Follow instructions from your health care provider about hydration, which may include: Up to 2 hours before the procedure - you may continue to drink clear liquids, such as water, clear fruit juice, black coffee, and plain tea. General instructions Do not drink alcohol. Do not use any products that contain nicotine or tobacco for at least 4 weeks before the procedure. These products include cigarettes, e-cigarettes, and chewing tobacco. If you need help quitting, ask your health care provider. You may have an exam or testing, such as imaging or blood tests. Ask your health care provider what steps will be taken to help prevent infection. These may include: Removing hair at the surgery site. Washing skin with a germ-killing soap. Taking antibiotic medicine. Plan to have someone take you home from the hospital or clinic. If you will be going home right  after the procedure, plan to have someone with you for 24 hours. What happens during the procedure? An IV will be inserted into one of your veins. You may be given a medicine to help you relax (sedative). You may be given a medicine to make you fall asleep (general anesthetic). A thin, tube-shaped instrument with a light and tiny camera at the end (cystoscope) will be inserted into your urethra. The urethra is the tube that drains urine from the bladder out of the body. In men, the urethra opens at the end of the penis. In women, the urethra opens in front of the vaginal opening. The cystoscope will be passed into your bladder. A thin wire (guide wire) will be passed through your bladder and into your ureter. This is used to guide the stent into your ureter. The stent will be inserted into your ureter. The guide wire and the cystoscope will be removed. A flexible tube (catheter) may be inserted through your urethra so that one end is in your bladder. This helps to drain urine from your bladder. The procedure may vary among hospitals   and health care providers. What happens after the procedure? Your blood pressure, heart rate, breathing rate, and blood oxygen level will be monitored until you leave the hospital or clinic. You may continue to receive medicine and fluids through an IV. You may have some soreness or pain in your abdomen and urethra. Medicines will be available to help you. You will be encouraged to get up and walk around as soon as you can. You may have a catheter draining your urine. You will have some blood in your urine. Do not drive for 24 hours if you were given a sedative during your procedure. Summary Ureteral stent implantation is a procedure to insert a flexible, soft, plastic tube (stent) into a ureter. You may have a stent implanted to support the ureter while it heals after a procedure or to open the flow of urine if there is a blockage. Follow instructions from your  health care provider about taking medicines and about eating and drinking before the procedure. Depending on your condition, you may have a stent for just a few weeks, or you may have a long-term stent that will need to be replaced every few months. This information is not intended to replace advice given to you by your health care provider. Make sure you discuss any questions you have with your health care provider. Document Revised: 12/14/2017 Document Reviewed: 12/15/2017 Elsevier Patient Education  2022 [employer].  Kidney Stones Kidney stones are solid, rock-like deposits that form inside of the kidneys. The kidneys are a pair of organs that make urine. A kidney stone may form in a kidney and move into other parts of the urinary tract, including the tubes that connect the kidneys to the bladder (ureters), the bladder, and the tube that carries urine out of the body (urethra). As the stone moves through these areas, it can cause intense pain and block the flow of urine. Kidney stones are created when high levels of certain minerals are found in the urine. The stones are usually passed out of the body through urination, but in some cases, medical treatment may be needed to remove them. What are the causes? Kidney stones may be caused by: A condition in which certain glands produce too much parathyroid hormone (primary hyperparathyroidism), which causes too much calcium buildup in the blood. A buildup of uric acid crystals in the bladder (hyperuricosuria). Uric acid is a chemical that the body produces when you eat certain foods. It usually exits the body in the urine. Narrowing (stricture) of one or both of the ureters. A kidney blockage that is present at birth (congenital obstruction). Past surgery on the kidney or the ureters, such as gastric bypass surgery. What increases the risk? The following factors may make you more likely to develop this condition: Having had a kidney stone in the  past. Having a family history of kidney stones. Not drinking enough water. Eating a diet that is high in protein, salt (sodium), or sugar. Being overweight or obese. What are the signs or symptoms? Symptoms of a kidney stone may include: Pain in the side of the abdomen, right below the ribs (flank pain). Pain usually spreads (radiates) to the groin. Needing to urinate frequently or urgently. Painful urination. Blood in the urine (hematuria). Nausea. Vomiting. Fever and chills. How is this diagnosed? This condition may be diagnosed based on: Your symptoms and medical history. A physical exam. Blood tests. Urine tests. These may be done before and after the stone passes out of your  body through urination. Imaging tests, such as a CT scan, abdominal X-ray, or ultrasound. A procedure to examine the inside of the bladder (cystoscopy). How is this treated? Treatment for kidney stones depends on the size, location, and makeup of the stones. Kidney stones will often pass out of the body through urination. You may need to: Increase your fluid intake to help pass the stone. In some cases, you may be given fluids through an IV and may need to be monitored at the hospital. Take medicine for pain. Make changes in your diet to help prevent kidney stones from coming back. Sometimes, medical procedures are needed to remove a kidney stone. This may involve: A procedure to break up kidney stones using: A focused beam of light (laser therapy). Shock waves (extracorporeal shock wave lithotripsy). Surgery to remove kidney stones. This may be needed if you have severe pain or have stones that block your urinary tract. Follow these instructions at home: Medicines Take over-the-counter and prescription medicines only as told by your health care provider. Ask your health care provider if the medicine prescribed to you requires you to avoid driving or using heavy machinery. Eating and drinking Drink  enough fluid to keep your urine pale yellow. You may be instructed to drink at least 8-10 glasses of water each day. This will help you pass the kidney stone. If directed, change your diet. This may include: Limiting how much sodium you eat. Eating more fruits and vegetables. Limiting how much animal protein--such as red meat, poultry, fish, and eggs--you eat. Follow instructions from your health care provider about eating or drinking restrictions. General instructions Collect urine samples as told by your health care provider. You may need to collect a urine sample: 24 hours after you pass the stone. 8-12 weeks after passing the kidney stone, and every 6-12 months after that. Strain your urine every time you urinate, for as long as directed. Use the strainer that your health care provider recommends. Do not throw out the kidney stone after passing it. Keep the stone so it can be tested by your health care provider. Testing the makeup of your kidney stone may help prevent you from getting kidney stones in the future. Keep all follow-up visits as told by your health care provider. This is important. You may need follow-up X-rays or ultrasounds to make sure that your stone has passed. How is this prevented? To prevent another kidney stone: Drink enough fluid to keep your urine pale yellow. This is the best way to prevent kidney stones. Eat a healthy diet and follow recommendations from your health care provider about foods to avoid. You may be instructed to eat a low-protein diet. Recommendations vary depending on the type of kidney stone that you have. Maintain a healthy weight. Where to find more information National Kidney Foundation (NKF): www.kidney.org [facility]): www.urologyhealth.org Contact a health care provider if: You have pain that gets worse or does not get better with medicine. Get help right away if: You have a fever or chills. You develop severe pain. You  develop new abdominal pain. You faint. You are unable to urinate. Summary Kidney stones are solid, rock-like deposits that form inside of the kidneys. Kidney stones can cause nausea, vomiting, blood in the urine, abdominal pain, and the urge to urinate frequently. Treatment for kidney stones depends on the size, location, and makeup of the stones. Kidney stones will often pass out of the body through urination. Kidney stones can be prevented by  drinking enough fluids, eating a healthy diet, and maintaining a healthy weight. This information is not intended to replace advice given to you by your health care provider. Make sure you discuss any questions you have with your health care provider. Document Revised: 07/22/2018 Document Reviewed: 07/26/2018 Elsevier Patient Education  2022 [employer].

## 2020-12-26 NOTE — Progress Notes (Signed)
Surgical Physician Order Form  ** Scheduling expectation :  Tuesday 10/18  *Length of Case: 30 minutes  *Clearance needed: no  *Anticoagulation Instructions: May continue all anticoagulants  *Aspirin Instructions: Ok to continue all  *Post-op visit Date/Instructions:   TBD  *Diagnosis: Left Ureteral Stone  *Procedure: Ureteroscopy w/laser lithotripsy & stent placement/exchange (38184)  -Admit type: OUTpatient  -Anesthesia: General  -VTE Prophylaxis Standing Order SCD's       Other:   -Standing Lab Orders Per Anesthesia    Lab other: None  -Standing Test orders EKG/Chest x-ray per Anesthesia       Test other:   - Medications:     Ancef 2gm IV   Other Instructions:

## 2020-12-26 NOTE — Telephone Encounter (Signed)
Pt aware of 6 mm LT kidney stone and elevated serum creat. Spoke with Michiel Cowboy at [facility]. Dr. Gabrielle Dare will see pt today at 11:30. Pt aware.

## 2020-12-26 NOTE — Progress Notes (Signed)
12/26/20 11:35 AM   Sherilyn Dacosta 05-11-1980 [id number]  CC: Left distal ureteral stone  HPI: I saw Ms. Scalf today for 6 to 8 weeks of intermittent left-sided groin pain.  She had extensive work-up with PCP with multiple urinalyses and cultures that were negative.  Her persistent pain ultimately prompted a CT stone protocol yesterday, which showed a 6 mm left distal ureteral stone with moderate to severe upstream hydronephrosis.  She denies any fevers, chills, or UTI symptoms.  She currently is taking ibuprofen with good relief of her pain.  She has not taken any Flomax.   PMH: Past Medical History:  Diagnosis Date   BRCA negative    MyRisk neg 2017   Family history of breast cancer 07/2015   IBIS=12%   Family history of ovarian cancer    Fam is BRCA/BART neg. Pt is NEG BRCA1 and BRCA 2 from June 2012, cousin of affected aunt is myrisk NEG   Mild depression    Vaccine for human papilloma virus (HPV) types 6, 11, 16, and 18 administered      Family History: Family History  Problem Relation Age of Onset   Ovarian cancer Maternal Aunt 42       BRCA/BART neg   Breast cancer Maternal Grandmother 21       both breasts   Ovarian cancer Maternal Grandmother    Hypertension Mother    Stroke Father    Heart failure Father    Hypertension Father    Diabetes Father    Heart failure Maternal Grandfather    Heart failure Paternal Grandfather     Social History:  reports that she has never smoked. She has never used smokeless tobacco. She reports current alcohol use. She reports that she does not use drugs.  Physical Exam: There were no vitals taken for this visit.   Constitutional:  Alert and oriented, No acute distress. Cardiovascular: No clubbing, cyanosis, or edema. Respiratory: Normal respiratory effort, no increased work of breathing. GI: Abdomen is soft, nontender, nondistended, no abdominal masses  Laboratory Data: Reviewed  Pertinent Imaging: I have  personally viewed and interpreted the CT showing a 6 mm left distal ureteral stone, upstream hydronephrosis, no other renal stones.  Assessment & Plan:   40 year old female with 6 to 8 weeks of left-sided intermittent groin pain secondary to a 6 mm left distal ureteral stone, no clinical or laboratory evidence of infection.  We discussed various treatment options for urolithiasis including observation with or without medical expulsive therapy, shockwave lithotripsy (SWL), ureteroscopy and laser lithotripsy with stent placement, and percutaneous nephrolithotomy.  We discussed that management is based on stone size, location, density, patient co-morbidities, and patient preference.   Stones <45m in size have a >80% spontaneous passage rate. Data surrounding the use of tamsulosin for medical expulsive therapy is controversial, but meta analyses suggests it is most efficacious for distal stones between 5-140min size. Possible side effects include dizziness/lightheadedness, and retrograde ejaculation.  SWL has a lower stone free rate in a single procedure, but also a lower complication rate compared to ureteroscopy and avoids a stent and associated stent related symptoms. Possible complications include renal hematoma, steinstrasse, and need for additional treatment.  Ureteroscopy with laser lithotripsy and stent placement has a higher stone free rate than SWL in a single procedure, however increased complication rate including possible infection, ureteral injury, bleeding, and stent related morbidity. Common stent related symptoms include dysuria, urgency/frequency, and flank pain.  I had a frank conversation  with the patient that with her long duration of symptoms of 6 to 8 weeks, this is less likely to pass spontaneously.  Looking at the numbers a 6 mm distal stone has about a 60% passage rate, and she is interested in a trial of Flomax prior to definitive management with surgery.  Unfortunately, I  think there is a good chance she ultimately will need ureteroscopy based on the duration of her symptoms, however I think a trial of medical expulsive therapy with Flomax is not unreasonable.  Return precautions discussed extensively.  -Flomax, Toradol for medical expulsive therapy, return precautions discussed extensively -Will tentatively schedule left ureteroscopy, laser lithotripsy, stent on 10/18 in case she does not pass the stone before then    Nickolas Madrid, MD 12/26/2020  [place] [address], [place], [place] [address] [phone]

## 2020-12-27 ENCOUNTER — Telehealth: Payer: Self-pay | Admitting: Urology

## 2020-12-27 NOTE — Progress Notes (Signed)
[facility] Posting Form   Surgery Date/Time: Date: 01/07/2021  Surgeon: Dr. Legrand Rams, MD  Surgery Location: Day Surgery  Inpt ( No  )   Outpt (Yes)   Obs ( No  )   Diagnosis: N20.1 Left Ureteral Stone  -CPT: [phone]   Surgery: Left URS/LL/Stent Placement  Stop Anticoagulations: No  Cardiac/Medical/Pulmonary Clearance needed: None Needed  *Orders entered into EPIC  Date: 12/27/2020    *Case booked in [place]  Date: 12/26/2020  *Notified pt of Surgery: Date: 12/26/2020  PRE-OP UA & CX: None  *Placed into Prior Authorization Work Que Date: 12/27/20   Assistant/laser/rep:No

## 2020-12-27 NOTE — Telephone Encounter (Signed)
Per Dr. Richardo Hanks Patient is to be scheduled for Left ureteroscopy,laser lithotripsy and ureteral stent placement  Mrs. Cubillos was contacted and possible surgical dates were discussed, 01/07/21 was agreed upon for surgery. Patient was directed to call [phone] between 1-3pm the day before surgery to find out surgical arrival time.  Instructions were given not to eat or drink from midnight on the night before surgery and have a driver for the day of surgery. On the surgery day patient was instructed to enter through the [facility] entrance of [facility] report the Same Day Surgery desk.   Pre-Admit Testing will be in contact via phone to set up an interview with the anesthesia team to review your history and medications prior to surgery.   Reminder of this information was sent via mychart to the patient.   Patient is not to hold anticoag's or ASA per Dr. Richardo Hanks.

## 2020-12-27 NOTE — Telephone Encounter (Signed)
Orders for Surgery entered and faxed over to Pre-Admit.

## 2020-12-28 LAB — URINE CULTURE: Organism ID, Bacteria: NO GROWTH

## 2020-12-30 ENCOUNTER — Telehealth: Payer: Self-pay

## 2020-12-30 DIAGNOSIS — N201 Calculus of ureter: Secondary | ICD-10-CM

## 2020-12-30 NOTE — Telephone Encounter (Signed)
Pt presents in clinic with stone that she states she passed today. Stone has been sent to the lab for analysis please advise on next steps.

## 2020-12-30 NOTE — Telephone Encounter (Signed)
Usually stent would come out within 1 week, so she would have a week of recovery time prior to going on vacation which should be fine.  Infection rate with ureteroscopy is about 3 to 4%, and we will keep her on the low-dose antibiotic with the stent to prevent any infection.  No activity restrictions even with the stent in place, so she would have no restrictions on her trip  Legrand Rams, MD 12/30/2020

## 2020-12-31 ENCOUNTER — Other Ambulatory Visit: Payer: 59

## 2020-12-31 ENCOUNTER — Telehealth: Payer: Self-pay | Admitting: Urology

## 2020-12-31 NOTE — Telephone Encounter (Signed)
Called pt informed her of the information below. Pt gave verbal understanding.  

## 2020-12-31 NOTE — Telephone Encounter (Signed)
Can take a few days for pain to completely resolved after passing a stone.  She can take Aleve or ibuprofen or the Toradol that was prescribed as needed, and continue the Flomax another few days.  If she is not feeling better by the end of the week, she should have a PA visit for UA and KUB, thanks  Legrand Rams, MD 12/31/2020

## 2020-12-31 NOTE — Telephone Encounter (Signed)
Pt. Called and would like to speak to someone about having abdominal pain since passing her stone. Pt. States she was having cramping before and after passing her stone. Cramping has subsided but she is still having mild abdominal pain. Please advise

## 2021-01-02 ENCOUNTER — Inpatient Hospital Stay: Admission: RE | Admit: 2021-01-02 | Payer: 59 | Source: Ambulatory Visit

## 2021-01-03 LAB — CALCULI, WITH PHOTOGRAPH (CLINICAL LAB)
Calcium Oxalate Dihydrate: 20 %
Calcium Oxalate Monohydrate: 50 %
Hydroxyapatite: 30 %
Weight Calculi: 91 mg

## 2021-01-07 ENCOUNTER — Encounter: Admission: RE | Payer: Self-pay | Source: Home / Self Care

## 2021-01-07 ENCOUNTER — Ambulatory Visit: Admission: RE | Admit: 2021-01-07 | Payer: 59 | Source: Home / Self Care | Admitting: Urology

## 2021-01-07 SURGERY — CYSTOSCOPY/URETEROSCOPY/HOLMIUM LASER/STENT PLACEMENT
Anesthesia: General | Laterality: Left

## 2021-01-08 ENCOUNTER — Telehealth: Payer: Self-pay

## 2021-01-08 NOTE — Telephone Encounter (Signed)
-----   Message from Sondra Come, MD sent at 01/08/2021  8:14 AM EDT ----- Kimberly Hendricks was primarily calcium oxalate, most common type of stone.  Keep follow-up as scheduled and recommend drinking plenty of fluids and low-salt diet for stone prevention  Legrand Rams, MD 01/08/2021

## 2021-03-18 ENCOUNTER — Other Ambulatory Visit: Payer: Self-pay | Admitting: Obstetrics and Gynecology

## 2021-03-18 DIAGNOSIS — Z3041 Encounter for surveillance of contraceptive pills: Secondary | ICD-10-CM

## 2021-03-31 ENCOUNTER — Ambulatory Visit: Payer: 59 | Admitting: Obstetrics and Gynecology

## 2021-04-03 ENCOUNTER — Other Ambulatory Visit: Payer: Self-pay

## 2021-04-03 ENCOUNTER — Encounter: Payer: Self-pay | Admitting: Obstetrics and Gynecology

## 2021-04-03 ENCOUNTER — Ambulatory Visit (INDEPENDENT_AMBULATORY_CARE_PROVIDER_SITE_OTHER): Payer: 59 | Admitting: Obstetrics and Gynecology

## 2021-04-03 VITALS — BP 120/70 | Ht 67.0 in | Wt 247.0 lb

## 2021-04-03 DIAGNOSIS — Z8041 Family history of malignant neoplasm of ovary: Secondary | ICD-10-CM | POA: Insufficient documentation

## 2021-04-03 DIAGNOSIS — Z3041 Encounter for surveillance of contraceptive pills: Secondary | ICD-10-CM

## 2021-04-03 DIAGNOSIS — Z803 Family history of malignant neoplasm of breast: Secondary | ICD-10-CM

## 2021-04-03 DIAGNOSIS — Z01419 Encounter for gynecological examination (general) (routine) without abnormal findings: Secondary | ICD-10-CM

## 2021-04-03 DIAGNOSIS — N921 Excessive and frequent menstruation with irregular cycle: Secondary | ICD-10-CM

## 2021-04-03 DIAGNOSIS — Z1371 Encounter for nonprocreative screening for genetic disease carrier status: Secondary | ICD-10-CM | POA: Insufficient documentation

## 2021-04-03 MED ORDER — DROSPIRENONE-ETHINYL ESTRADIOL 3-0.02 MG PO TABS
1.0000 | ORAL_TABLET | Freq: Every day | ORAL | 3 refills | Status: DC
Start: 2021-04-03 — End: 2022-07-30

## 2021-04-03 NOTE — Progress Notes (Signed)
PCP:  Adin Hector, MD   Chief Complaint  Patient presents with   Gynecologic Exam    No concerns    HPI:      [name] is a 41 y.o. No obstetric history on file. who LMP was No LMP recorded. (Menstrual status: Irregular Periods)., presents today for her annual examination.  Her menses are regular every 28-30 days, lasting 4 days, mod flow on OCPs. Has had BTB for several months, light spotting for several days, no late/missed pills.  Dysmenorrhea mild to mod, improved with NSAIDs. Started OCPs 2 yrs ago for menorrhagia with neg labs/GYN u/s 11/20 with sx improvement, wants to continue. Had IUD in past with cycle control.   Sex activity: single partner, contraception - vasectomy.  Last Pap: 01/31/19  Results were: no abnormalities /neg HPV DNA   There is a FH of breast cancer in her MGM. There is a FH of ovarian cancer in her MGM and mat aunt. Mat aunt is BRCA neg and her daughter/cousin is MyRisk neg. Pt is MyRisk neg 2017; IBIS=12%. The patient does not do self-breast exams.  Tobacco use: The patient denies current or previous tobacco use. Alcohol use: couple drinks wkly No drug use.  Exercise: not active  She does get adequate calcium and Vitamin D in her diet. Labs with PCP.  Seeing psych for anxiety/depression with Rx mgmt.   Past Medical History:  Diagnosis Date   BRCA negative    MyRisk neg 2017   Family history of breast cancer 07/2015   IBIS=12%   Family history of ovarian cancer    Fam is BRCA/BART neg. Pt is NEG BRCA1 and BRCA 2 from June 2012, cousin of affected aunt is myrisk NEG   Kidney stone    Mild depression    Vaccine for human papilloma virus (HPV) types 6, 11, 16, and 18 administered     Past Surgical History:  Procedure Laterality Date   NO PAST SURGERIES      Family History  Problem Relation Age of Onset   Ovarian cancer Maternal Aunt 22       BRCA/BART neg   Breast cancer Maternal Grandmother 41       both breasts    Ovarian cancer Maternal Grandmother    Hypertension Mother    Stroke Father    Heart failure Father    Hypertension Father    Diabetes Father    Heart failure Maternal Grandfather    Heart failure Paternal Grandfather     Social History   Socioeconomic History   Marital status: Single    Spouse name: Not on file   Number of children: Not on file   Years of education: Not on file   Highest education level: Not on file  Occupational History   Not on file  Tobacco Use   Smoking status: Never   Smokeless tobacco: Never  Vaping Use   Vaping Use: Never used  Substance and Sexual Activity   Alcohol use: Yes   Drug use: Never   Sexual activity: Yes    Birth control/protection: Surgical, Pill    Comment: Vasectomy  Other Topics Concern   Not on file  Social History Narrative   Not on file   Social Determinants of Health   Financial Resource Strain: Not on file  Food Insecurity: Not on file  Transportation Needs: Not on file  Physical Activity: Not on file  Stress: Not on file  Social Connections: Not  on file  Intimate Partner Violence: Not on file     Current Outpatient Medications:    buPROPion (WELLBUTRIN XL) 150 MG 24 hr tablet, Take 150 mg by mouth every morning., Disp: , Rfl:    buPROPion (WELLBUTRIN XL) 300 MG 24 hr tablet, Take 300 mg by mouth every morning., Disp: , Rfl:    Cholecalciferol 50 MCG (2000 UT) CAPS, Take 2,000 Units by mouth daily., Disp: , Rfl:    CONCERTA 36 MG CR tablet, Take 36 mg by mouth every morning., Disp: , Rfl:    Cyanocobalamin (VITAMIN B-12 PO), Take 1 tablet by mouth daily., Disp: , Rfl:    drospirenone-ethinyl estradiol (YAZ) 3-0.02 MG tablet, Take 1 tablet by mouth daily., Disp: 84 tablet, Rfl: 3   hydrOXYzine (ATARAX/VISTARIL) 25 MG tablet, Take 25-50 mg by mouth at bedtime., Disp: , Rfl:    naproxen sodium (ALEVE) 220 MG tablet, Take 220 mg by mouth in the morning., Disp: , Rfl:    topiramate (TOPAMAX) 50 MG tablet, Take 50 mg by  mouth daily., Disp: , Rfl:    VIIBRYD 20 MG TABS, Take 20 mg by mouth daily., Disp: , Rfl:      ROS:  Review of Systems  Constitutional:  Negative for fatigue, fever and unexpected weight change.  Respiratory:  Negative for cough, shortness of breath and wheezing.   Cardiovascular:  Negative for chest pain, palpitations and leg swelling.  Gastrointestinal:  Negative for blood in stool, constipation, diarrhea, nausea and vomiting.  Endocrine: Negative for cold intolerance, heat intolerance and polyuria.  Genitourinary:  Positive for vaginal bleeding. Negative for dyspareunia, dysuria, flank pain, frequency, genital sores, hematuria, menstrual problem, pelvic pain, urgency, vaginal discharge and vaginal pain.  Musculoskeletal:  Positive for arthralgias. Negative for back pain, joint swelling and myalgias.  Skin:  Negative for rash.  Neurological:  Negative for dizziness, syncope, light-headedness, numbness and headaches.  Hematological:  Negative for adenopathy.  Psychiatric/Behavioral:  Negative for agitation, confusion, dysphoric mood, sleep disturbance and suicidal ideas. The patient is not nervous/anxious.   BREAST: No symptoms   Objective: BP 120/70    Ht _0  (1.702 m)    Wt 247 lb (112 kg)    BMI 38.69 kg/m    Physical Exam Constitutional:      Appearance: She is well-developed.  Genitourinary:     Vulva normal.     Right Labia: No rash, tenderness or lesions.    Left Labia: No tenderness, lesions or rash.    No vaginal discharge, erythema or tenderness.      Right Adnexa: not tender and no mass present.    Left Adnexa: not tender and no mass present.    No cervical friability or polyp.     Uterus is not enlarged or tender.  Breasts:    Right: No mass, nipple discharge, skin change or tenderness.     Left: No mass, nipple discharge, skin change or tenderness.  Neck:     Thyroid: No thyromegaly.  Cardiovascular:     Rate and Rhythm: Normal rate and regular rhythm.      Heart sounds: Normal heart sounds. No murmur heard. Pulmonary:     Effort: Pulmonary effort is normal.     Breath sounds: Normal breath sounds.  Abdominal:     Palpations: Abdomen is soft.     Tenderness: There is no abdominal tenderness. There is no guarding or rebound.  Musculoskeletal:        General: Normal range of motion.  Cervical back: Normal range of motion.  Lymphadenopathy:     Cervical: No cervical adenopathy.  Neurological:     General: No focal deficit present.     Mental Status: She is alert and oriented to person, place, and time.     Cranial Nerves: No cranial nerve deficit.  Skin:    General: Skin is warm and dry.  Psychiatric:        Mood and Affect: Mood normal.        Behavior: Behavior normal.        Thought Content: Thought content normal.        Judgment: Judgment normal.  Vitals reviewed.    Assessment/Plan: Encounter for annual routine gynecological examination  Encounter for surveillance of contraceptive pills - Plan: drospirenone-ethinyl estradiol (YAZ) 3-0.02 MG tablet; OCP Rx eRxd. Change to yaz. Improved menorrhagia on OCPs.   Breakthrough bleeding on OCPs - Plan: drospirenone-ethinyl estradiol (YAZ) 3-0.02 MG tablet; neg GYN u/s 11/20. Change OCPs to yaz. Rx eRxd. F/u prn. Will eval further if sx persist.   Family history of breast cancer--pt is MyRisk neg; no increased screening recommendations. Start mammos age 41.   Meds ordered this encounter  Medications   drospirenone-ethinyl estradiol (YAZ) 3-0.02 MG tablet    Sig: Take 1 tablet by mouth daily.    Dispense:  84 tablet    Refill:  3    Order Specific Question:   Supervising Provider    Answer:   Gae Dry [[id number]]              GYN counsel adequate intake of calcium and vitamin D, diet and exercise     F/U  Return in about 1 year (around 04/03/2022).  Craigory Toste B. Jermaine Neuharth, PA-C 04/03/2021 9:14 AM

## 2021-04-03 NOTE — Patient Instructions (Signed)
I value your feedback and you entrusting us with your care. If you get a [employer] patient survey, I would appreciate you taking the time to let us know about your experience today. Thank you! ? ? ?

## 2021-05-20 ENCOUNTER — Other Ambulatory Visit: Payer: Self-pay

## 2021-05-20 ENCOUNTER — Ambulatory Visit: Payer: 59 | Admitting: Podiatry

## 2021-05-20 ENCOUNTER — Ambulatory Visit: Payer: 59

## 2021-05-20 DIAGNOSIS — M722 Plantar fascial fibromatosis: Secondary | ICD-10-CM

## 2021-05-23 NOTE — Progress Notes (Signed)
Subjective:  Patient ID: Kimberly Hendricks, female    DOB: February 26, 1981,  MRN: [id number]  Chief Complaint  Patient presents with   Foot Pain    Right heel pain for about 6 months    41 y.o. female presents with the above complaint.  Patient presents with right heel pain that is now for 6 months has progressed gotten worse.  Pain is in the arch of the foot and also the foot with constant walking.  Pain now started to go back up to the heel.  She has been diagnosed with plantar fasciitis in the past.  She states is painful to touch painful to walk on.  She would like to discuss treatment options for this.  She has not seen anyone else prior to seeing me.  She denies any other acute complaints.   Review of Systems: Negative except as noted in the HPI. Denies N/V/F/Ch.  Past Medical History:  Diagnosis Date   BRCA negative    MyRisk neg 2017   Family history of breast cancer 07/2015   IBIS=12%   Family history of ovarian cancer    Fam is BRCA/BART neg. Pt is NEG BRCA1 and BRCA 2 from June 2012, cousin of affected aunt is myrisk NEG   Kidney stone    Mild depression    Vaccine for human papilloma virus (HPV) types 6, 11, 16, and 18 administered     Current Outpatient Medications:    buPROPion (WELLBUTRIN XL) 150 MG 24 hr tablet, Take 150 mg by mouth every morning., Disp: , Rfl:    buPROPion (WELLBUTRIN XL) 300 MG 24 hr tablet, Take 300 mg by mouth every morning., Disp: , Rfl:    Cholecalciferol 50 MCG (2000 UT) CAPS, Take 2,000 Units by mouth daily., Disp: , Rfl:    CONCERTA 36 MG CR tablet, Take 36 mg by mouth every morning., Disp: , Rfl:    Cyanocobalamin (VITAMIN B-12 PO), Take 1 tablet by mouth daily., Disp: , Rfl:    drospirenone-ethinyl estradiol (YAZ) 3-0.02 MG tablet, Take 1 tablet by mouth daily., Disp: 84 tablet, Rfl: 3   hydrOXYzine (ATARAX/VISTARIL) 25 MG tablet, Take 25-50 mg by mouth at bedtime., Disp: , Rfl:    naproxen sodium (ALEVE) 220 MG tablet, Take 220 mg by mouth in  the morning., Disp: , Rfl:    topiramate (TOPAMAX) 50 MG tablet, Take 50 mg by mouth daily., Disp: , Rfl:    VIIBRYD 20 MG TABS, Take 20 mg by mouth daily., Disp: , Rfl:   Social History   Tobacco Use  Smoking Status Never  Smokeless Tobacco Never    No Known Allergies Objective:  There were no vitals filed for this visit. There is no height or weight on file to calculate BMI. Constitutional Well developed. Well nourished.  Vascular Dorsalis pedis pulses palpable bilaterally. Posterior tibial pulses palpable bilaterally. Capillary refill normal to all digits.  No cyanosis or clubbing noted. Pedal hair growth normal.  Neurologic Normal speech. Oriented to person, place, and time. Epicritic sensation to light touch grossly present bilaterally.  Dermatologic Nails well groomed and normal in appearance. No open wounds. No skin lesions.  Orthopedic: Normal joint ROM without pain or crepitus bilaterally. No visible deformities. Tender to palpation at the calcaneal tuber right. No pain with calcaneal squeeze right. Ankle ROM diminished range of motion right. Silfverskiold Test: positive right.   Radiographs: Taken and reviewed. No acute fractures or dislocations. No evidence of stress fracture.  Plantar heel spur present.  Posterior heel spur absent.   Assessment:   1. Plantar fasciitis of right foot    Plan:  Patient was evaluated and treated and all questions answered.  Plantar Fasciitis, right - XR reviewed as above.  - Educated on icing and stretching. Instructions given.  - Injection delivered to the plantar fascia as below. - DME: Plantar fascial brace dispensed to support the medial longitudinal arch of the foot and offload pressure from the heel and prevent arch collapse during weightbearing - Pharmacologic management: None  Procedure: Injection Tendon/Ligament Location: Right plantar fascia at the glabrous junction; medial approach. Skin Prep: alcohol Injectate:  0.5 cc 0.5% marcaine plain, 0.5 cc of 1% Lidocaine, 0.5 cc kenalog 10. Disposition: Patient tolerated procedure well. Injection site dressed with a band-aid.  No follow-ups on file.

## 2021-06-17 ENCOUNTER — Other Ambulatory Visit: Payer: Self-pay

## 2021-06-17 ENCOUNTER — Ambulatory Visit (INDEPENDENT_AMBULATORY_CARE_PROVIDER_SITE_OTHER): Payer: 59 | Admitting: Podiatry

## 2021-06-17 ENCOUNTER — Encounter: Payer: Self-pay | Admitting: Podiatry

## 2021-06-17 DIAGNOSIS — M778 Other enthesopathies, not elsewhere classified: Secondary | ICD-10-CM

## 2021-06-17 DIAGNOSIS — M19079 Primary osteoarthritis, unspecified ankle and foot: Secondary | ICD-10-CM | POA: Diagnosis not present

## 2021-06-17 DIAGNOSIS — M722 Plantar fascial fibromatosis: Secondary | ICD-10-CM | POA: Diagnosis not present

## 2021-06-17 NOTE — Progress Notes (Signed)
?Subjective:  ?Patient ID: ANTRICE PAL, female    DOB: 1981-02-12,  MRN: [id number] ? ?Chief Complaint  ?Patient presents with  ? Plantar Fasciitis  ? ? ?41 y.o. female presents with the above complaint.  Patient presents for follow-up right plantar fasciitis.  She states she is doing a lot better the injection helped.  She still has some residual pain.  She has a new complaint of left dorsal midfoot pain.  She states painful to touch came out of nowhere has progressed gotten worse.  She wanted get it evaluated and wanted to discuss treatment options with. ? ?Review of Systems: Negative except as noted in the HPI. Denies N/V/F/Ch. ? ?Past Medical History:  ?Diagnosis Date  ? BRCA negative   ? MyRisk neg 2017  ? Family history of breast cancer 07/2015  ? IBIS=12%  ? Family history of ovarian cancer   ? Fam is BRCA/BART neg. Pt is NEG BRCA1 and BRCA 2 from June 2012, cousin of affected aunt is myrisk NEG  ? Kidney stone   ? Mild depression   ? Vaccine for human papilloma virus (HPV) types 6, 11, 16, and 18 administered   ? ? ?Current Outpatient Medications:  ?  buPROPion (WELLBUTRIN XL) 150 MG 24 hr tablet, Take 150 mg by mouth every morning., Disp: , Rfl:  ?  buPROPion (WELLBUTRIN XL) 300 MG 24 hr tablet, Take 300 mg by mouth every morning., Disp: , Rfl:  ?  Cholecalciferol 50 MCG (2000 UT) CAPS, Take 2,000 Units by mouth daily., Disp: , Rfl:  ?  CONCERTA 36 MG CR tablet, Take 36 mg by mouth every morning., Disp: , Rfl:  ?  Cyanocobalamin (VITAMIN B-12 PO), Take 1 tablet by mouth daily., Disp: , Rfl:  ?  drospirenone-ethinyl estradiol (YAZ) 3-0.02 MG tablet, Take 1 tablet by mouth daily., Disp: 84 tablet, Rfl: 3 ?  hydrOXYzine (ATARAX/VISTARIL) 25 MG tablet, Take 25-50 mg by mouth at bedtime., Disp: , Rfl:  ?  naproxen sodium (ALEVE) 220 MG tablet, Take 220 mg by mouth in the morning., Disp: , Rfl:  ?  topiramate (TOPAMAX) 50 MG tablet, Take 50 mg by mouth daily., Disp: , Rfl:  ?  VIIBRYD 20 MG TABS, Take 20 mg  by mouth daily., Disp: , Rfl:  ? ?Social History  ? ?Tobacco Use  ?Smoking Status Never  ?Smokeless Tobacco Never  ? ? ?No Known Allergies ?Objective:  ?There were no vitals filed for this visit. ?There is no height or weight on file to calculate BMI. ?Constitutional Well developed. ?Well nourished.  ?Vascular Dorsalis pedis pulses palpable bilaterally. ?Posterior tibial pulses palpable bilaterally. ?Capillary refill normal to all digits.  ?No cyanosis or clubbing noted. ?Pedal hair growth normal.  ?Neurologic Normal speech. ?Oriented to person, place, and time. ?Epicritic sensation to light touch grossly present bilaterally.  ?Dermatologic Nails well groomed and normal in appearance. ?No open wounds. ?No skin lesions.  ?Orthopedic: Normal joint ROM without pain or crepitus bilaterally. ?No visible deformities. ?Tender to palpation at the calcaneal tuber right. ?No pain with calcaneal squeeze right. ?Ankle ROM diminished range of motion right. ?Silfverskiold Test: positive right. ? ?Pain on palpation left dorsal midfoot.  Clinically able to appreciate some arthritic changes to the underlying midfoot.  Positive neuritis noted as well likely due to the arthritic flare.  ? ?Radiographs: Taken and reviewed. No acute fractures or dislocations. No evidence of stress fracture.  Plantar heel spur present. Posterior heel spur absent.  ? ?Assessment:  ? ?  1. Plantar fasciitis of right foot   ?2. Capsulitis of foot, left   ?3. Arthritis of midfoot   ? ? ?Plan:  ?Patient was evaluated and treated and all questions answered. ? ?Left dorsal lateral midfoot capsulitis with underlying midfoot arthritis ?-I explained the patient the etiology of capsulitis and worse treatment options were discussed.  Given the amount of pain that she is having I believe she will benefit from a steroid injection to help decrease acute inflammatory component associate with pain.  Patient agrees with plan like to proceed with a steroid injection ?-A  steroid injection was performed at Left dorsal midfoot using 1% plain Lidocaine and 10 mg of Kenalog. This was well tolerated. ? ? ?Plantar Fasciitis, right ?- XR reviewed as above.  ?- Educated on icing and stretching. Instructions given.  ?-Second injection delivered to the plantar fascia as below. ?- DME: Plantar fascial brace dispensed to support the medial longitudinal arch of the foot and offload pressure from the heel and prevent arch collapse during weightbearing ?- Pharmacologic management: None ? ?Procedure: Injection Tendon/Ligament ?Location: Right plantar fascia at the glabrous junction; medial approach. ?Skin Prep: alcohol ?Injectate: 0.5 cc 0.5% marcaine plain, 0.5 cc of 1% Lidocaine, 0.5 cc kenalog 10. ?Disposition: Patient tolerated procedure well. Injection site dressed with a band-aid. ? ?No follow-ups on file. ?

## 2021-07-01 ENCOUNTER — Other Ambulatory Visit: Payer: Self-pay | Admitting: *Deleted

## 2021-07-01 ENCOUNTER — Other Ambulatory Visit: Payer: 59

## 2021-07-01 DIAGNOSIS — N201 Calculus of ureter: Secondary | ICD-10-CM

## 2021-07-02 ENCOUNTER — Ambulatory Visit: Payer: 59 | Admitting: Urology

## 2021-07-08 ENCOUNTER — Other Ambulatory Visit: Payer: Self-pay | Admitting: Urology

## 2021-07-15 ENCOUNTER — Ambulatory Visit: Payer: 59 | Admitting: Podiatry

## 2021-07-15 DIAGNOSIS — M722 Plantar fascial fibromatosis: Secondary | ICD-10-CM

## 2021-07-16 ENCOUNTER — Encounter: Payer: Self-pay | Admitting: Podiatry

## 2021-07-22 ENCOUNTER — Encounter: Payer: Self-pay | Admitting: Podiatry

## 2021-07-22 NOTE — Progress Notes (Signed)
?Subjective:  ?Patient ID: Kimberly Hendricks, female    DOB: February 08, 1981,  MRN: [id number] ? ?Chief Complaint  ?Patient presents with  ? Plantar Fasciitis  ?  Pt stated that there has been some improvement   ? ? ?41 y.o. female presents with the above complaint.  Patient presents for follow-up right plantar fasciitis.  She states the last injection did not help.  The left side is doing well.  The right side has progressively is been painful.  She denies any other acute complaints.  She would like to discuss next treatment plan ? ?Review of Systems: Negative except as noted in the HPI. Denies N/V/F/Ch. ? ?Past Medical History:  ?Diagnosis Date  ? BRCA negative   ? MyRisk neg 2017  ? Family history of breast cancer 07/2015  ? IBIS=12%  ? Family history of ovarian cancer   ? Fam is BRCA/BART neg. Pt is NEG BRCA1 and BRCA 2 from June 2012, cousin of affected aunt is myrisk NEG  ? Kidney stone   ? Mild depression   ? Vaccine for human papilloma virus (HPV) types 6, 11, 16, and 18 administered   ? ? ?Current Outpatient Medications:  ?  buPROPion (WELLBUTRIN XL) 150 MG 24 hr tablet, Take 150 mg by mouth every morning., Disp: , Rfl:  ?  buPROPion (WELLBUTRIN XL) 300 MG 24 hr tablet, Take 300 mg by mouth every morning., Disp: , Rfl:  ?  Cholecalciferol 50 MCG (2000 UT) CAPS, Take 2,000 Units by mouth daily., Disp: , Rfl:  ?  CONCERTA 36 MG CR tablet, Take 36 mg by mouth every morning., Disp: , Rfl:  ?  Cyanocobalamin (VITAMIN B-12 PO), Take 1 tablet by mouth daily., Disp: , Rfl:  ?  drospirenone-ethinyl estradiol (YAZ) 3-0.02 MG tablet, Take 1 tablet by mouth daily., Disp: 84 tablet, Rfl: 3 ?  hydrOXYzine (ATARAX/VISTARIL) 25 MG tablet, Take 25-50 mg by mouth at bedtime., Disp: , Rfl:  ?  naproxen sodium (ALEVE) 220 MG tablet, Take 220 mg by mouth in the morning., Disp: , Rfl:  ?  topiramate (TOPAMAX) 50 MG tablet, Take 50 mg by mouth daily., Disp: , Rfl:  ?  VIIBRYD 20 MG TABS, Take 20 mg by mouth daily., Disp: , Rfl:   ? ?Social History  ? ?Tobacco Use  ?Smoking Status Never  ?Smokeless Tobacco Never  ? ? ?No Known Allergies ?Objective:  ?There were no vitals filed for this visit. ?There is no height or weight on file to calculate BMI. ?Constitutional Well developed. ?Well nourished.  ?Vascular Dorsalis pedis pulses palpable bilaterally. ?Posterior tibial pulses palpable bilaterally. ?Capillary refill normal to all digits.  ?No cyanosis or clubbing noted. ?Pedal hair growth normal.  ?Neurologic Normal speech. ?Oriented to person, place, and time. ?Epicritic sensation to light touch grossly present bilaterally.  ?Dermatologic Nails well groomed and normal in appearance. ?No open wounds. ?No skin lesions.  ?Orthopedic: Normal joint ROM without pain or crepitus bilaterally. ?No visible deformities. ?Tender to palpation at the calcaneal tuber right. ?No pain with calcaneal squeeze right. ?Ankle ROM diminished range of motion right. ?Silfverskiold Test: positive right. ? ?Pain on palpation left dorsal midfoot.  Clinically able to appreciate some arthritic changes to the underlying midfoot.  Positive neuritis noted as well likely due to the arthritic flare.  ? ?Radiographs: Taken and reviewed. No acute fractures or dislocations. No evidence of stress fracture.  Plantar heel spur present. Posterior heel spur absent.  ? ?Assessment:  ? ?1. Plantar fasciitis of  right foot   ? ? ? ?Plan:  ?Patient was evaluated and treated and all questions answered. ? ?Left dorsal lateral midfoot capsulitis with underlying midfoot arthritis ?-Clinically healed and is doing well. ? ? ?Plantar Fasciitis, right ?- XR reviewed as above.  ?- Educated on icing and stretching. Instructions given.  ?-Second injection delivered to the plantar fascia as below. ?- DME: Cam boot immobilization ?- Pharmacologic management: None ?-As reached out to Kristen/Vicki to find out the cost of the orthotics and let the patient know. ? ?Procedure: Injection  Tendon/Ligament ?Location: Right plantar fascia at the glabrous junction; medial approach. ?Skin Prep: alcohol ?Injectate: 0.5 cc 0.5% marcaine plain, 0.5 cc of 1% Lidocaine, 0.5 cc kenalog 10. ?Disposition: Patient tolerated procedure well. Injection site dressed with a band-aid. ? ?No follow-ups on file. ?

## 2021-07-23 ENCOUNTER — Ambulatory Visit
Admission: RE | Admit: 2021-07-23 | Discharge: 2021-07-23 | Disposition: A | Discharge status: Home / Self Care | Payer: 59 | Source: Ambulatory Visit | Attending: Urology | Admitting: Urology

## 2021-07-23 DIAGNOSIS — N201 Calculus of ureter: Secondary | ICD-10-CM | POA: Diagnosis not present

## 2021-07-24 ENCOUNTER — Encounter: Payer: Self-pay | Admitting: Urology

## 2021-07-24 ENCOUNTER — Ambulatory Visit (INDEPENDENT_AMBULATORY_CARE_PROVIDER_SITE_OTHER): Payer: 59 | Admitting: Urology

## 2021-07-24 VITALS — Ht 67.0 in | Wt 247.0 lb

## 2021-07-24 DIAGNOSIS — Z87442 Personal history of urinary calculi: Secondary | ICD-10-CM | POA: Diagnosis not present

## 2021-07-24 DIAGNOSIS — N200 Calculus of kidney: Secondary | ICD-10-CM

## 2021-07-24 NOTE — Progress Notes (Signed)
? ?  07/24/2021 ?2:47 PM  ? ?Kimberly Hendricks ?[birth year] ?[id number] ? ?Reason for visit: Follow up nephrolithiasis ? ?HPI: ?41 year old female with 2 prior episodes of calcium oxalate stones.  I personally viewed and interpreted her KUB today that shows no evidence of radiopaque stones.  She denies any flank pain, gross hematuria or urinary symptoms. ? ?24-hour urine today notable for low urine volume of 2 L, elevated urine calcium of 333, elevated urine sodium of 237, low urine citrate of 395, urine pH 6.45. ? ?We discussed general stone prevention strategies including adequate hydration with goal of producing 2.5 L of urine daily, increasing citric acid intake, increasing calcium intake during high oxalate meals, minimizing animal protein, and decreasing salt intake. Information about dietary recommendations given today.  I encouraged her to focus on increasing citrate in the diet through citrate rich beverages, as well as decrease salt intake which will improve the high urine calcium.  Finally, I recommended she discuss alternatives to topiramate, as these can contribute to kidney stone formation. ? ?RTC 1 year KUB prior ? ? ?Sondra Come, MD ? ?[facility] ?[address] ?[place], [place] [address] ?[phone] ? ?

## 2021-07-24 NOTE — Patient Instructions (Signed)
Would recommend trying to find an alternative to topiramate(Topamax) as this medication can contribute to kidney stone formation.  In terms of diet continue to drink plenty of fluids, and adding lemon juice/lemonade/grapefruit juice/Crystal light lemonade to fluids can help increase citrate which prevents stones.  The most important thing is to decrease salt in your diet, as you are high salt levels are contributing to increased calcium and stone formation. ? ? ?Dietary Guidelines to Help Prevent Kidney Stones ?Kidney stones are deposits of minerals and salts that form inside your kidneys. Your risk of developing kidney stones may be greater depending on your diet, your lifestyle, the medicines you take, and whether you have certain medical conditions. Most people can lower their chances of developing kidney stones by following the instructions below. Your dietitian may give you more specific instructions depending on your overall health and the type of kidney stones you tend to develop. ?What are tips for following this plan? ?Reading food labels ? ?Choose foods with "no salt added" or "low-salt" labels. Limit your salt (sodium) intake to less than 1,500 mg a day. ?Choose foods with calcium for each meal and snack. Try to eat about 300 mg of calcium at each meal. Foods that contain 200-500 mg of calcium a serving include: ?8 oz (237 mL) of milk, calcium-fortifiednon-dairy milk, and calcium-fortifiedfruit juice. Calcium-fortified means that calcium has been added to these drinks. ?8 oz (237 mL) of kefir, yogurt, and soy yogurt. ?4 oz (114 g) of tofu. ?1 oz (28 g) of cheese. ?1 cup (150 g) of dried figs. ?1 cup (91 g) of cooked broccoli. ?One 3 oz (85 g) can of sardines or mackerel. ?Most people need 1,000-1,500 mg of calcium a day. Talk to your dietitian about how much calcium is recommended for you. ?Shopping ?Buy plenty of fresh fruits and vegetables. Most people do not need to avoid fruits and vegetables, even if  these foods contain nutrients that may contribute to kidney stones. ?When shopping for convenience foods, choose: ?Whole pieces of fruit. ?Pre-made salads with dressing on the side. ?Low-fat fruit and yogurt smoothies. ?Avoid buying frozen meals or prepared deli foods. These can be high in sodium. ?Look for foods with live cultures, such as yogurt and kefir. ?Choose high-fiber grains, such as whole-wheat breads, oat bran, and wheat cereals. ?Cooking ?Do not add salt to food when cooking. Place a salt shaker on the table and allow each person to add his or her own salt to taste. ?Use vegetable protein, such as beans, textured vegetable protein (TVP), or tofu, instead of meat in pasta, casseroles, and soups. ?Meal planning ?Eat less salt, if told by your dietitian. To do this: ?Avoid eating processed or pre-made food. ?Avoid eating fast food. ?Eat less animal protein, including cheese, meat, poultry, or fish, if told by your dietitian. To do this: ?Limit the number of times you have meat, poultry, fish, or cheese each week. Eat a diet free of meat at least 2 days a week. ?Eat only one serving each day of meat, poultry, fish, or seafood. ?When you prepare animal protein, cut pieces into small portion sizes. For most meat and fish, one serving is about the size of the palm of your hand. ?Eat at least five servings of fresh fruits and vegetables each day. To do this: ?Keep fruits and vegetables on hand for snacks. ?Eat one piece of fruit or a handful of berries with breakfast. ?Have a salad and fruit at lunch. ?Have two kinds of vegetables  at dinner. ?Limit foods that are high in a substance called oxalate. These include: ?Spinach (cooked), rhubarb, beets, sweet potatoes, and Swiss chard. ?Peanuts. ?Potato chips, french fries, and baked potatoes with skin on. ?Nuts and nut products. ?Chocolate. ?If you regularly take a diuretic medicine, make sure to eat at least 1 or 2 servings of fruits or vegetables that are high in  potassium each day. These include: ?Avocado. ?Banana. ?Orange, prune, carrot, or tomato juice. ?Baked potato. ?Cabbage. ?Beans and split peas. ?Lifestyle ? ?Drink enough fluid to keep your urine pale yellow. This is the most important thing you can do. Spread your fluid intake throughout the day. ?If you drink alcohol: ?Limit how much you use to: ?0-1 drink a day for women who are not pregnant. ?0-2 drinks a day for men. ?Be aware of how much alcohol is in your drink. In the [place] one drink equals one 12 oz bottle of beer (355 mL), one 5 oz glass of wine (148 mL), or one 1? oz glass of hard liquor (44 mL). ?Lose weight if told by your health care provider. Work with your dietitian to find an eating plan and weight loss strategies that work best for you. ?General information ?Talk to your health care provider and dietitian about taking daily supplements. You may be told the following depending on your health and the cause of your kidney stones: ?Not to take supplements with vitamin C. ?To take a calcium supplement. ?To take a daily probiotic supplement. ?To take other supplements such as magnesium, fish oil, or vitamin B6. ?Take over-the-counter and prescription medicines only as told by your health care provider. These include supplements. ?What foods should I limit? ?Limit your intake of the following foods, or eat them as told by your dietitian. ?Vegetables ?Spinach. Rhubarb. Beets. Canned vegetables. Rosita FirePickles. Olives. Baked potatoes with skin. ?Grains ?Wheat bran. Baked goods. Salted crackers. Cereals high in sugar. ?Meats and other proteins ?Nuts. Nut butters. Large portions of meat, poultry, or fish. Salted, precooked, or cured meats, such as sausages, meat loaves, and hot dogs. ?Dairy ?Cheese. ?Beverages ?Regular soft drinks. Regular vegetable juice. ?Seasonings and condiments ?Seasoning blends with salt. Salad dressings. Soy sauce. Ketchup. Barbecue sauce. ?Other foods ?Canned soups. Canned pasta sauce.  Casseroles. Pizza. Lasagna. Frozen meals. Potato chips. JamaicaFrench fries. ?The items listed above may not be a complete list of foods and beverages you should limit. Contact a dietitian for more information. ?What foods should I avoid? ?Talk to your dietitian about specific foods you should avoid based on the type of kidney stones you have and your overall health. ?Fruits ?Grapefruit. ?The item listed above may not be a complete list of foods and beverages you should avoid. Contact a dietitian for more information. ?Summary ?Kidney stones are deposits of minerals and salts that form inside your kidneys. ?You can lower your risk of kidney stones by making changes to your diet. ?The most important thing you can do is drink enough fluid. Drink enough fluid to keep your urine pale yellow. ?Talk to your dietitian about how much calcium you should have each day, and eat less salt and animal protein as told by your dietitian. ?This information is not intended to replace advice given to you by your health care provider. Make sure you discuss any questions you have with your health care provider. ?Document Revised: 11/18/2020 Document Reviewed: 11/18/2020 ?Elsevier Patient Education ? 2023 Elsevier Inc. ? ?Low-Sodium Eating Plan ?Sodium, which is an element that makes up salt, helps you  maintain a healthy balance of fluids in your body. Too much sodium can increase your blood pressure and cause fluid and waste to be held in your body. ?Your health care provider or dietitian may recommend following this plan if you have high blood pressure (hypertension), kidney disease, liver disease, or heart failure. Eating less sodium can help lower your blood pressure, reduce swelling, and protect your heart, liver, and kidneys. ?What are tips for following this plan? ?Reading food labels ?The Nutrition Facts label lists the amount of sodium in one serving of the food. If you eat more than one serving, you must multiply the listed amount of  sodium by the number of servings. ?Choose foods with less than 140 mg of sodium per serving. ?Avoid foods with 300 mg of sodium or more per serving. ?Shopping ? ?Look for lower-sodium products, often labele

## 2021-08-12 ENCOUNTER — Ambulatory Visit: Payer: 59 | Admitting: Podiatry

## 2021-08-12 DIAGNOSIS — M722 Plantar fascial fibromatosis: Secondary | ICD-10-CM

## 2021-08-12 NOTE — Progress Notes (Signed)
Subjective:  Patient ID: Kimberly Hendricks, female    DOB: January 31, 1981,  MRN: [id number]  Chief Complaint  Patient presents with   Plantar Fasciitis    41 y.o. female presents with the above complaint.  Patient presents for follow-up right plantar fasciitis.  She states the last injection did not help.  She states the right side still keeps hurting.  Has not gotten any better.  Hurts with ambulation.  The cam boot she tried on and off.  Review of Systems: Negative except as noted in the HPI. Denies N/V/F/Ch.  Past Medical History:  Diagnosis Date   BRCA negative    MyRisk neg 2017   Family history of breast cancer 07/2015   IBIS=12%   Family history of ovarian cancer    Fam is BRCA/BART neg. Pt is NEG BRCA1 and BRCA 2 from June 2012, cousin of affected aunt is myrisk NEG   Kidney stone    Mild depression    Vaccine for human papilloma virus (HPV) types 6, 11, 16, and 18 administered     Current Outpatient Medications:    buPROPion (WELLBUTRIN XL) 150 MG 24 hr tablet, Take 150 mg by mouth every morning., Disp: , Rfl:    buPROPion (WELLBUTRIN XL) 300 MG 24 hr tablet, Take 300 mg by mouth every morning., Disp: , Rfl:    Cholecalciferol 50 MCG (2000 UT) CAPS, Take 2,000 Units by mouth daily., Disp: , Rfl:    CONCERTA 36 MG CR tablet, Take 36 mg by mouth every morning., Disp: , Rfl:    Cyanocobalamin (VITAMIN B-12 PO), Take 1 tablet by mouth daily., Disp: , Rfl:    drospirenone-ethinyl estradiol (YAZ) 3-0.02 MG tablet, Take 1 tablet by mouth daily., Disp: 84 tablet, Rfl: 3   hydrOXYzine (ATARAX/VISTARIL) 25 MG tablet, Take 25-50 mg by mouth at bedtime., Disp: , Rfl:    naproxen sodium (ALEVE) 220 MG tablet, Take 220 mg by mouth in the morning., Disp: , Rfl:    topiramate (TOPAMAX) 50 MG tablet, Take 50 mg by mouth daily., Disp: , Rfl:    VIIBRYD 20 MG TABS, Take 20 mg by mouth daily., Disp: , Rfl:   Social History   Tobacco Use  Smoking Status Never   Passive exposure: Never   Smokeless Tobacco Never    No Known Allergies Objective:  There were no vitals filed for this visit. There is no height or weight on file to calculate BMI. Constitutional Well developed. Well nourished.  Vascular Dorsalis pedis pulses palpable bilaterally. Posterior tibial pulses palpable bilaterally. Capillary refill normal to all digits.  No cyanosis or clubbing noted. Pedal hair growth normal.  Neurologic Normal speech. Oriented to person, place, and time. Epicritic sensation to light touch grossly present bilaterally.  Dermatologic Nails well groomed and normal in appearance. No open wounds. No skin lesions.  Orthopedic: Normal joint ROM without pain or crepitus bilaterally. No visible deformities. Tender to palpation at the calcaneal tuber right. No pain with calcaneal squeeze right. Ankle ROM diminished range of motion right. Silfverskiold Test: positive right.  Pain on palpation left dorsal midfoot.  Clinically able to appreciate some arthritic changes to the underlying midfoot.  Positive neuritis noted as well likely due to the arthritic flare.   Radiographs: Taken and reviewed. No acute fractures or dislocations. No evidence of stress fracture.  Plantar heel spur present. Posterior heel spur absent.   Assessment:   1. Plantar fasciitis of right foot      Plan:  Patient was  evaluated and treated and all questions answered.  Left dorsal lateral midfoot capsulitis with underlying midfoot arthritis -Clinically healed and is doing well.   Plantar Fasciitis, right - XR reviewed as above.  - Educated on icing and stretching. Instructions given.  -Second injection delivered to the plantar fascia as below. - DME: Continue using cam boot immobilization - Pharmacologic management: None -Patient's insurance did not cover orthotics.  She will think about getting the orthotics. -Patient will be more compliant with cam boot immobilization if there is no improvement in 4  weeks we will discuss surgical options.  She states understanding  Procedure: Injection Tendon/Ligament Location: Right plantar fascia at the glabrous junction; medial approach. Skin Prep: alcohol Injectate: 0.5 cc 0.5% marcaine plain, 0.5 cc of 1% Lidocaine, 0.5 cc kenalog 10. Disposition: Patient tolerated procedure well. Injection site dressed with a band-aid.  No follow-ups on file.

## 2021-09-09 ENCOUNTER — Ambulatory Visit: Payer: 59 | Admitting: Podiatry

## 2021-09-09 DIAGNOSIS — M722 Plantar fascial fibromatosis: Secondary | ICD-10-CM

## 2021-09-09 NOTE — Progress Notes (Signed)
Subjective:  Patient ID: Kimberly Hendricks, female    DOB: 28-May-1980,  MRN: [id number]  Chief Complaint  Patient presents with   Plantar Fasciitis    41 y.o. female presents with the above complaint.  Patient presents for follow-up right plantar fasciitis.  She states the cam boot helped pretty good amount.  She is not feeling the pain as much.  She wanted to discuss the options of chiropractor as well.  Review of Systems: Negative except as noted in the HPI. Denies N/V/F/Ch.  Past Medical History:  Diagnosis Date   BRCA negative    MyRisk neg 2017   Family history of breast cancer 07/2015   IBIS=12%   Family history of ovarian cancer    Fam is BRCA/BART neg. Pt is NEG BRCA1 and BRCA 2 from June 2012, cousin of affected aunt is myrisk NEG   Kidney stone    Mild depression    Vaccine for human papilloma virus (HPV) types 6, 11, 16, and 18 administered     Current Outpatient Medications:    buPROPion (WELLBUTRIN XL) 150 MG 24 hr tablet, Take 150 mg by mouth every morning., Disp: , Rfl:    buPROPion (WELLBUTRIN XL) 300 MG 24 hr tablet, Take 300 mg by mouth every morning., Disp: , Rfl:    Cholecalciferol 50 MCG (2000 UT) CAPS, Take 2,000 Units by mouth daily., Disp: , Rfl:    CONCERTA 36 MG CR tablet, Take 36 mg by mouth every morning., Disp: , Rfl:    Cyanocobalamin (VITAMIN B-12 PO), Take 1 tablet by mouth daily., Disp: , Rfl:    drospirenone-ethinyl estradiol (YAZ) 3-0.02 MG tablet, Take 1 tablet by mouth daily., Disp: 84 tablet, Rfl: 3   hydrOXYzine (ATARAX/VISTARIL) 25 MG tablet, Take 25-50 mg by mouth at bedtime., Disp: , Rfl:    naproxen sodium (ALEVE) 220 MG tablet, Take 220 mg by mouth in the morning., Disp: , Rfl:    topiramate (TOPAMAX) 50 MG tablet, Take 50 mg by mouth daily., Disp: , Rfl:    VIIBRYD 20 MG TABS, Take 20 mg by mouth daily., Disp: , Rfl:   Social History   Tobacco Use  Smoking Status Never   Passive exposure: Never  Smokeless Tobacco Never    No  Known Allergies Objective:  There were no vitals filed for this visit. There is no height or weight on file to calculate BMI. Constitutional Well developed. Well nourished.  Vascular Dorsalis pedis pulses palpable bilaterally. Posterior tibial pulses palpable bilaterally. Capillary refill normal to all digits.  No cyanosis or clubbing noted. Pedal hair growth normal.  Neurologic Normal speech. Oriented to person, place, and time. Epicritic sensation to light touch grossly present bilaterally.  Dermatologic Nails well groomed and normal in appearance. No open wounds. No skin lesions.  Orthopedic: Normal joint ROM without pain or crepitus bilaterally. No visible deformities. Tender to palpation at the calcaneal tuber right. No pain with calcaneal squeeze right. Ankle ROM diminished range of motion right. Silfverskiold Test: positive right.  Pain on palpation left dorsal midfoot.  Clinically able to appreciate some arthritic changes to the underlying midfoot.  Positive neuritis noted as well likely due to the arthritic flare.   Radiographs: Taken and reviewed. No acute fractures or dislocations. No evidence of stress fracture.  Plantar heel spur present. Posterior heel spur absent.   Assessment:   No diagnosis found.    Plan:  Patient was evaluated and treated and all questions answered.  Left dorsal lateral midfoot  capsulitis with underlying midfoot arthritis -Clinically healed and is doing well.   Plantar Fasciitis, right - XR reviewed as above.  - Educated on icing and stretching. Instructions given.  -Second injection delivered to the plantar fascia as below. - DME: Continue using cam boot immobilization - Pharmacologic management: None -Patient's insurance did not cover orthotics.  We will plan on doing the orthotics after inflammation has calmed down -Continue wearing boot and I will see her back again in 4 weeks to discuss surgical options if there is no  improvement -Patient will go see a chiropractor for further management   No follow-ups on file.

## 2021-10-07 ENCOUNTER — Ambulatory Visit: Payer: 59 | Admitting: Podiatry

## 2022-02-14 ENCOUNTER — Other Ambulatory Visit: Payer: Self-pay | Admitting: Obstetrics and Gynecology

## 2022-02-14 DIAGNOSIS — N921 Excessive and frequent menstruation with irregular cycle: Secondary | ICD-10-CM

## 2022-02-14 DIAGNOSIS — Z3041 Encounter for surveillance of contraceptive pills: Secondary | ICD-10-CM

## 2022-02-24 ENCOUNTER — Other Ambulatory Visit: Payer: Self-pay | Admitting: Obstetrics and Gynecology

## 2022-02-24 DIAGNOSIS — N921 Excessive and frequent menstruation with irregular cycle: Secondary | ICD-10-CM

## 2022-02-24 DIAGNOSIS — Z3041 Encounter for surveillance of contraceptive pills: Secondary | ICD-10-CM
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
## 2022-03-25 ENCOUNTER — Other Ambulatory Visit: Payer: Self-pay | Admitting: Obstetrics and Gynecology

## 2022-03-25 DIAGNOSIS — N921 Excessive and frequent menstruation with irregular cycle: Secondary | ICD-10-CM

## 2022-03-25 DIAGNOSIS — Z3041 Encounter for surveillance of contraceptive pills: Secondary | ICD-10-CM

## 2022-07-29 ENCOUNTER — Other Ambulatory Visit: Payer: Self-pay | Admitting: Urology

## 2022-07-29 DIAGNOSIS — N200 Calculus of kidney: Secondary | ICD-10-CM

## 2022-07-30 ENCOUNTER — Ambulatory Visit
Admission: RE | Admit: 2022-07-30 | Discharge: 2022-07-30 | Disposition: A | Discharge status: Home / Self Care | Payer: 59 | Source: Ambulatory Visit | Attending: Urology | Admitting: Urology

## 2022-07-30 ENCOUNTER — Ambulatory Visit: Payer: 59 | Admitting: Urology

## 2022-07-30 ENCOUNTER — Encounter: Payer: Self-pay | Admitting: Urology

## 2022-07-30 VITALS — BP 132/84 | HR 88 | Ht 67.0 in | Wt 243.0 lb

## 2022-07-30 DIAGNOSIS — N200 Calculus of kidney: Secondary | ICD-10-CM

## 2022-07-30 MED ORDER — TAMSULOSIN HCL 0.4 MG PO CAPS: 0.4000 mg | ORAL_CAPSULE | Freq: Every day | ORAL | 0 refills | Status: DC

## 2022-07-30 NOTE — Progress Notes (Signed)
   07/30/2022 3:46 PM   Kimberly Hendricks 03/27/1980 [id number]  Reason for visit: Follow up nephrolithiasis  HPI: 42 year old female with 2 prior episodes of calcium oxalate stones.  She denies any flank pain, gross hematuria or urinary symptoms.  No stone events in the last year.  She is off topiramate.  24-hour urine 2023 notable for low urine volume of 2 L, elevated urine calcium of 333, elevated urine sodium of 237, low urine citrate of 395, urine pH 6.45.  We focused primarily on decrease salt in the diet to decrease the calcium level.  I personally viewed and interpreted the KUB today that shows a new 3 mm left midpole stone.  We reviewed options including surveillance, ureteroscopy, or shockwave lithotripsy, and she opts for surveillance.  We discussed general stone prevention strategies including adequate hydration with goal of producing 2.5 L of urine daily, increasing citric acid intake, increasing calcium intake during high oxalate meals, minimizing animal protein, and decreasing salt intake. Information about dietary recommendations given today.  I encouraged her to focus on increasing citrate in the diet through citrate rich beverages, as well as decrease salt intake which will improve the high urine calcium.    -Flomax prescription sent in in case she has migration of her 3 mm left renal stone, as this was very helpful for her previously when she passed stone spontaneously -RTC 1 year KUB prior   Sondra Come, MD  [facility] [address] [place], [place] [address] [phone]

## 2022-09-03 ENCOUNTER — Other Ambulatory Visit: Payer: Self-pay | Admitting: Urology

## 2022-09-28 ENCOUNTER — Other Ambulatory Visit: Payer: Self-pay

## 2022-09-28 ENCOUNTER — Other Ambulatory Visit: Payer: 59

## 2022-09-28 DIAGNOSIS — N200 Calculus of kidney: Secondary | ICD-10-CM

## 2022-10-02 ENCOUNTER — Encounter: Payer: Self-pay | Admitting: Urology

## 2022-10-07 LAB — CALCULI, WITH PHOTOGRAPH (CLINICAL LAB)
Calcium Oxalate Dihydrate: 30 %
Calcium Oxalate Monohydrate: 20 %
Hydroxyapatite: 50 %
Weight Calculi: 43 mg

## 2023-02-20 IMAGING — CT CT RENAL STONE PROTOCOL
2 of 4 series · 17 of 46 positions shown, 19 images · non-contrast
Comparison: 03/27/2019

CLINICAL DATA: Flank pain, stone suspected

EXAM:
CT ABDOMEN AND PELVIS WITHOUT CONTRAST
TECHNIQUE: Multidetector CT imaging of the abdomen and pelvis was performed
following the standard protocol without IV contrast.

[Series 2: stone full standard · axial · 0.95mm/px · z∈[-848,-384]mm · 14 of 103 slices shown, 16 images]
[im 5/103  soft-tissue]
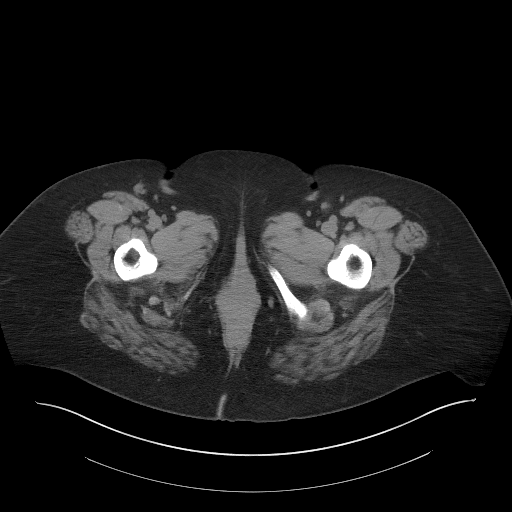
[im 5/103  bone]
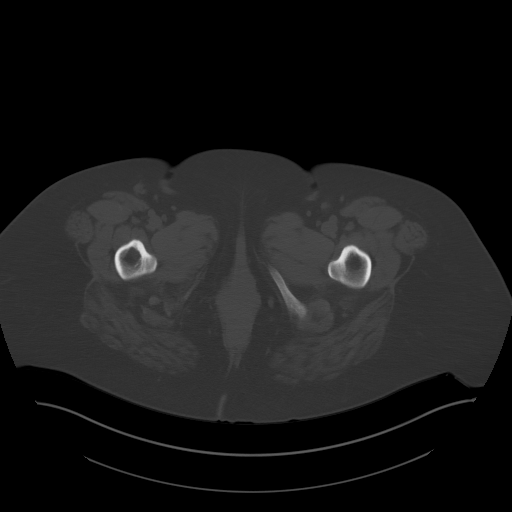
[im 13/103  soft-tissue]
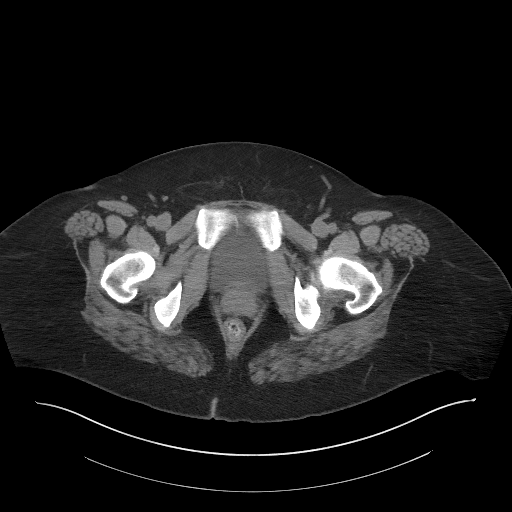
[im 21/103  soft-tissue]
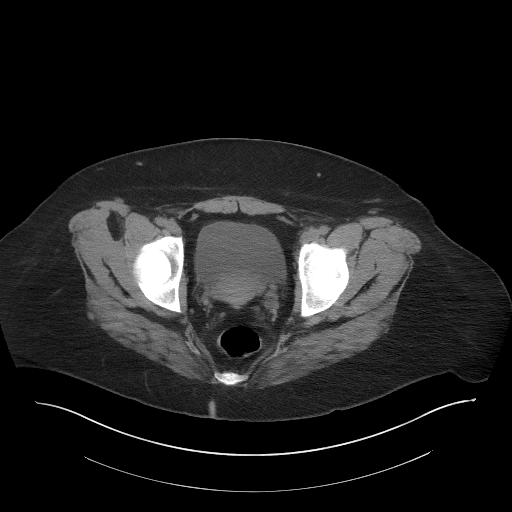
[im 29/103  soft-tissue]
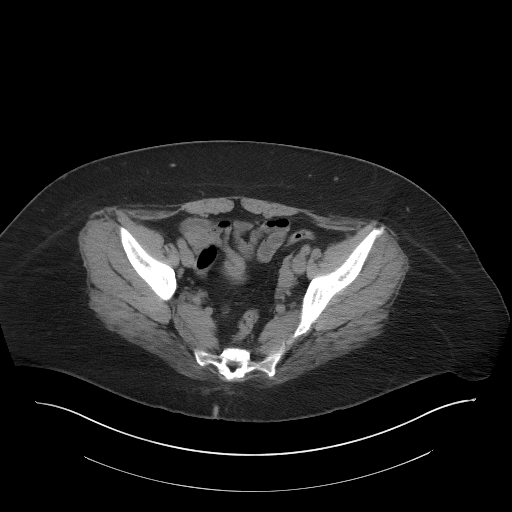
[im 33/103  soft-tissue]
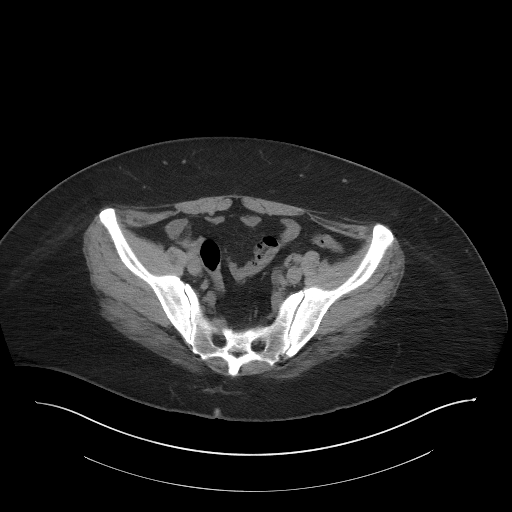
[im 41/103  soft-tissue]
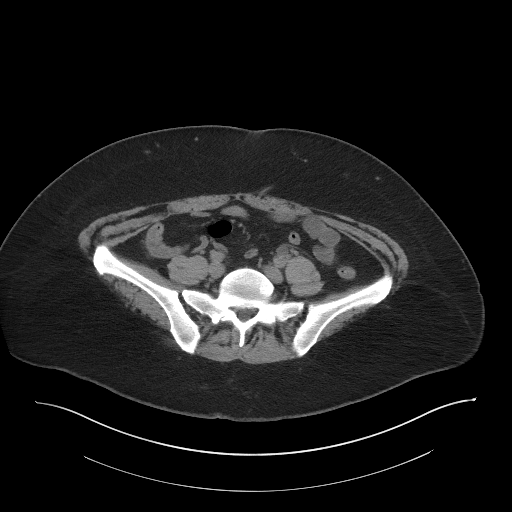
[im 49/103  soft-tissue]
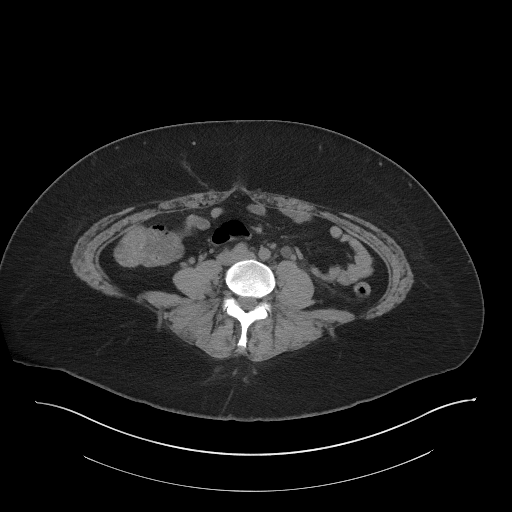
[im 54/103  soft-tissue]
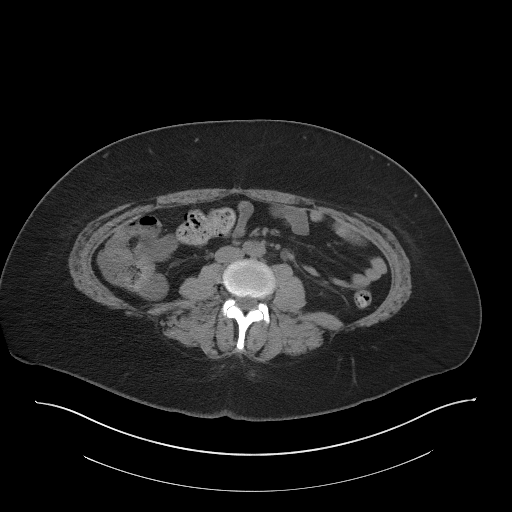
[im 62/103  soft-tissue]
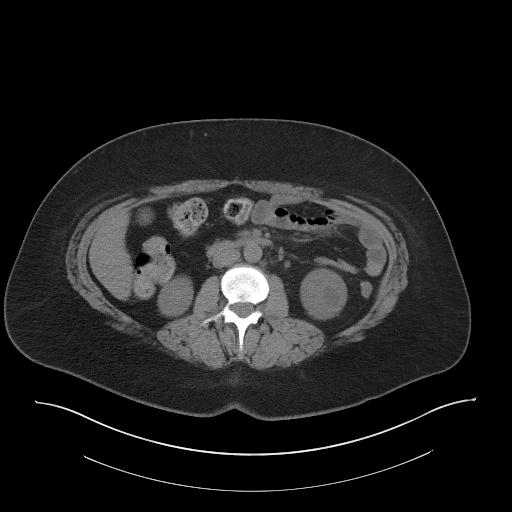
[im 62/103  bone]
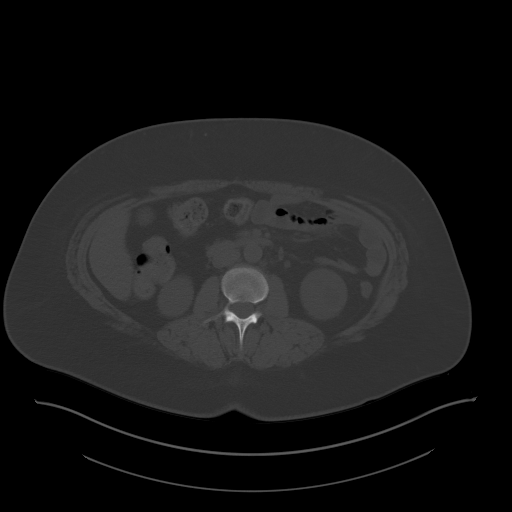
[im 70/103  soft-tissue]
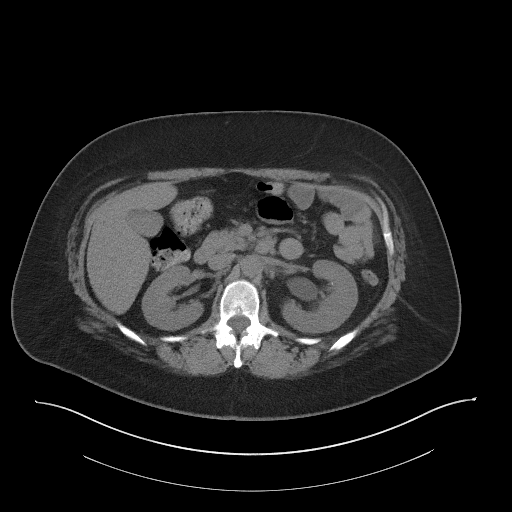
[im 78/103  soft-tissue]
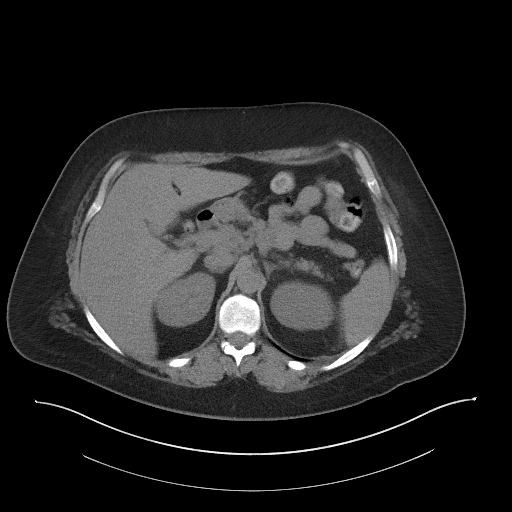
[im 82/103  soft-tissue]
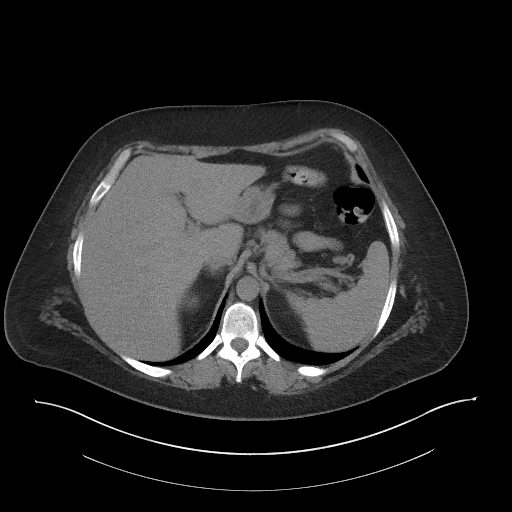
[im 90/103  soft-tissue]
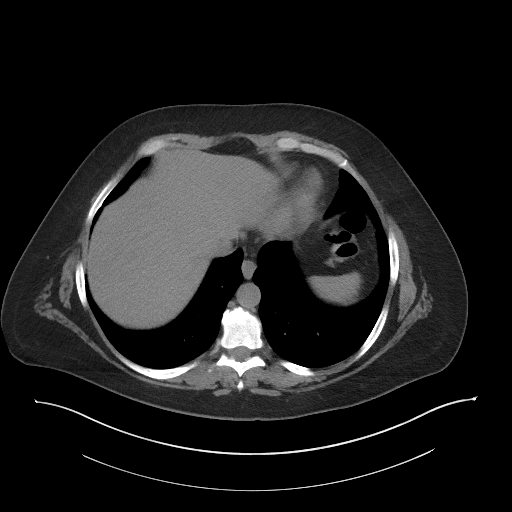
[im 98/103  soft-tissue]
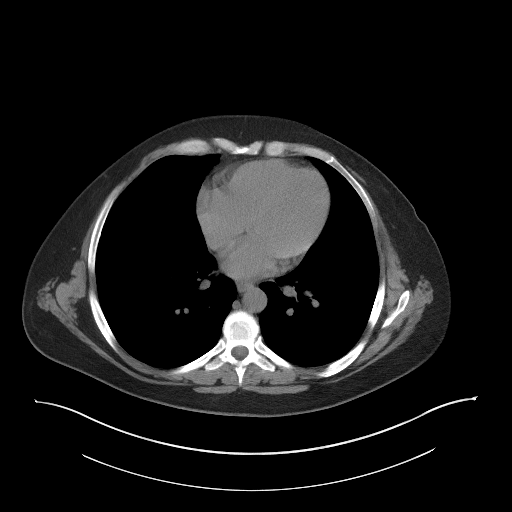

[Series 5: coronal · coronal · 0.97mm/px · 3 of 141 slices shown]
[im 47/141  soft-tissue]
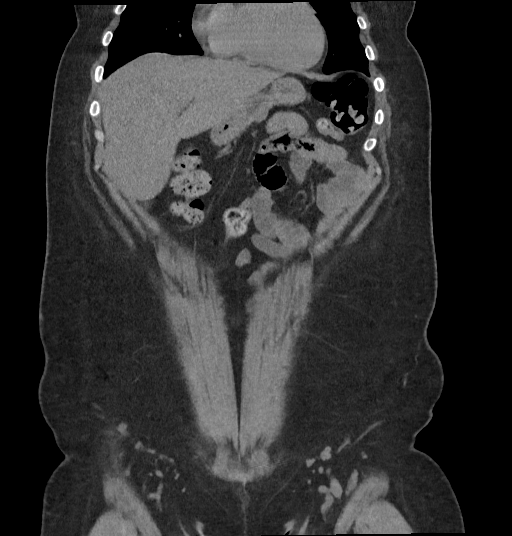
[im 63/141  soft-tissue]
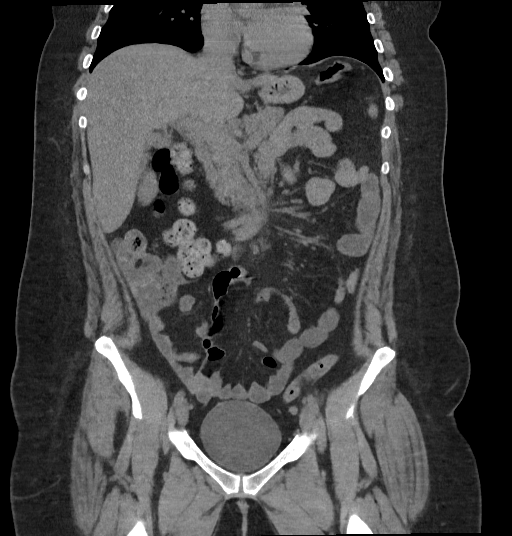
[im 78/141  soft-tissue]
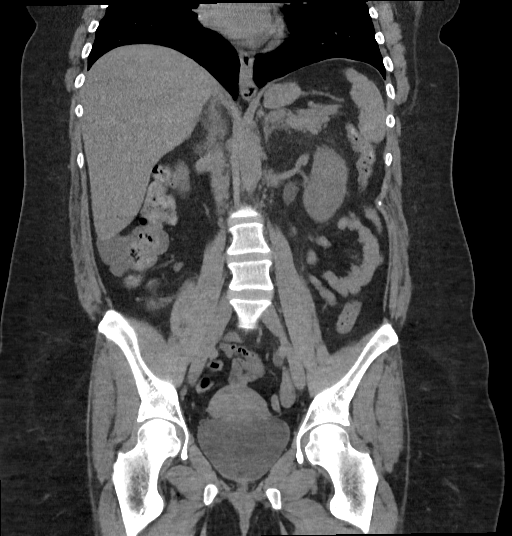

[17 of 46 positions shown; findings below may reference images not displayed]

FINDINGS: Lower chest: No acute abnormality.

Hepatobiliary: No focal liver abnormality is seen. No gallstones,
gallbladder wall thickening, or biliary dilatation.

Pancreas: Unremarkable. No pancreatic ductal dilatation or
surrounding inflammatory changes.

Spleen: Normal in size without focal abnormality.

Adrenals/Urinary Tract: The adrenal glands are unremarkable.
Punctate nonobstructing nephrolithiasis within the left kidney. The
left ureter is moderately dilated, with a 6 mm stone, which appears
impacted at the left UVJ, although this can not be definitively
determined as the patient is supine. Mild left hydronephrosis. The
right kidney is unremarkable. No right nephrolithiasis or
urolithiasis.

Stomach/Bowel: Stomach is within normal limits. No evidence of bowel
wall thickening, distention, or inflammatory changes.

Vascular/Lymphatic: No significant vascular findings are present. No
enlarged abdominal or pelvic lymph nodes.

Reproductive: Uterus and bilateral adnexa are unremarkable.

Other: No abdominal wall hernia or abnormality. No abdominopelvic
ascites.

Musculoskeletal: Minimal S shaped curvature of the thoracolumbar
spine. No acute osseous abnormality.
IMPRESSION: 6 mm stone near the left UVJ, favored to be impacted (although
evaluation is limited by supine positioning), with left
ureterectasis and mild left hydronephrosis.

## 2023-07-22 HISTORY — PX: COLONOSCOPY: SHX174

## 2023-07-23 ENCOUNTER — Encounter

## 2023-08-03 ENCOUNTER — Other Ambulatory Visit: Payer: Self-pay

## 2023-08-03 DIAGNOSIS — N200 Calculus of kidney: Secondary | ICD-10-CM

## 2023-08-05 ENCOUNTER — Ambulatory Visit: Payer: Self-pay | Admitting: Urology

## 2023-08-20 ENCOUNTER — Ambulatory Visit

## 2023-08-20 DIAGNOSIS — D128 Benign neoplasm of rectum: Secondary | ICD-10-CM | POA: Diagnosis present

## 2023-08-20 DIAGNOSIS — K640 First degree hemorrhoids: Secondary | ICD-10-CM | POA: Diagnosis not present

## 2023-09-18 IMAGING — CR DG ABDOMEN 1V
2 series · 2 of 2 positions shown · non-contrast
Comparison: 07/30/2020 CT without contrast

CLINICAL DATA: History of left ureteral calculus 6 months ago

EXAM:
ABDOMEN - 1 VIEW

[abdomen kub (1 of 2)]
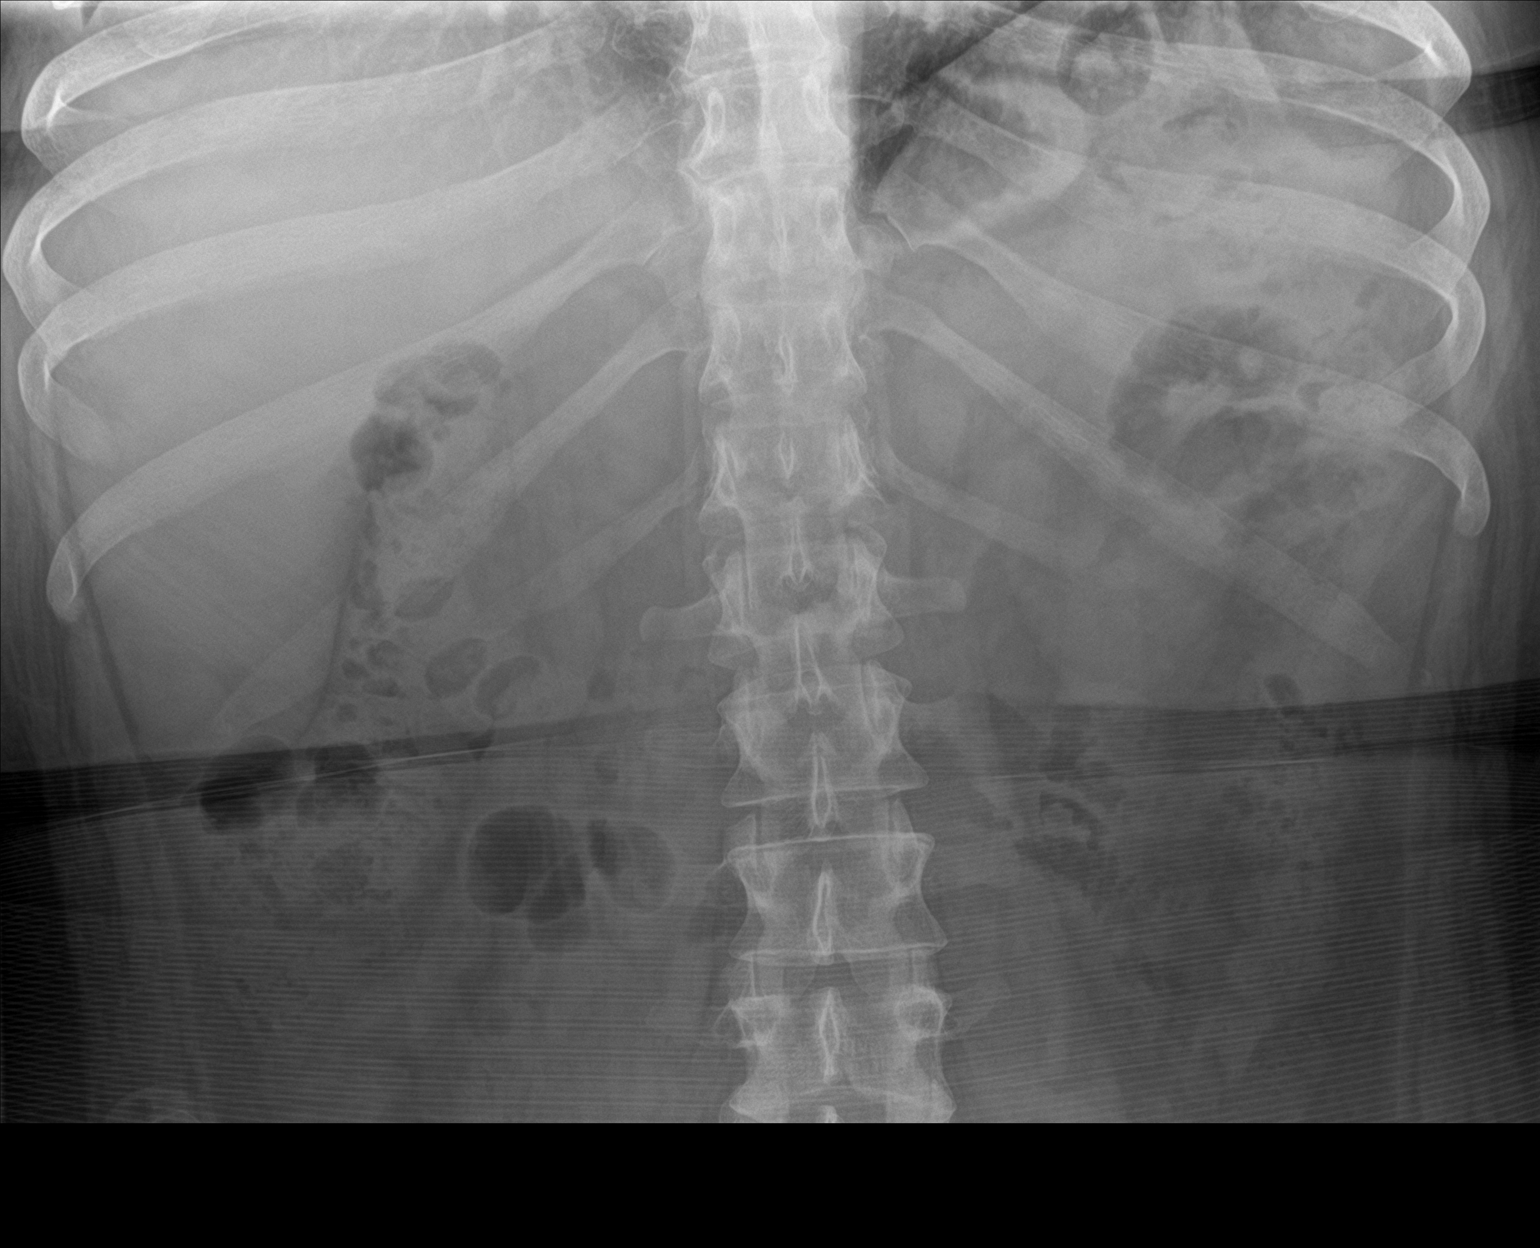

[abdomen kub (2 of 2)]
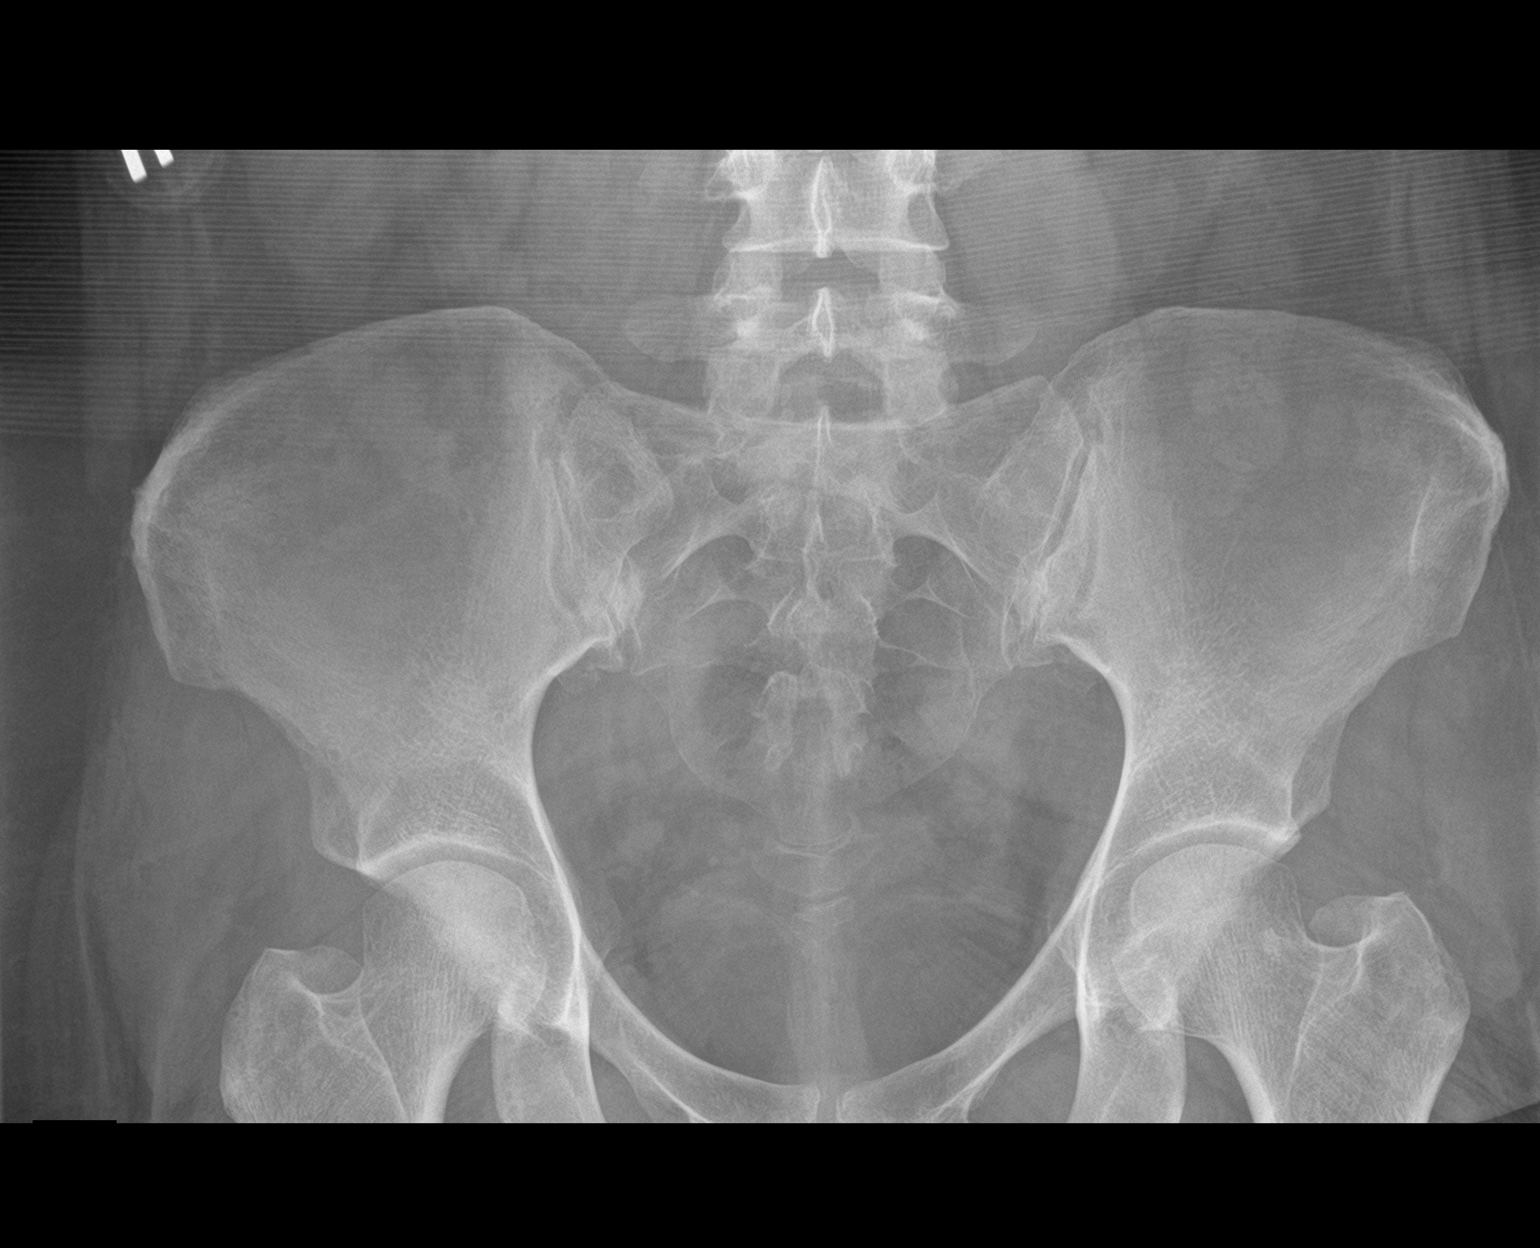

[2 of 2 positions shown; findings below may reference images not displayed]

FINDINGS: The bowel gas pattern is normal. No radio-opaque calculi or other
significant radiographic abnormality are seen.
IMPRESSION: Negative.

## 2023-09-30 ENCOUNTER — Ambulatory Visit
Admission: RE | Admit: 2023-09-30 | Discharge: 2023-09-30 | Disposition: A | Discharge status: Home / Self Care | Source: Ambulatory Visit | Attending: Urology | Admitting: Urology

## 2023-09-30 ENCOUNTER — Ambulatory Visit (INDEPENDENT_AMBULATORY_CARE_PROVIDER_SITE_OTHER): Admitting: Urology

## 2023-09-30 ENCOUNTER — Ambulatory Visit
Admission: RE | Admit: 2023-09-30 | Discharge: 2023-09-30 | Disposition: A | Discharge status: Home / Self Care | Attending: Urology | Admitting: Urology

## 2023-09-30 VITALS — BP 131/64 | HR 76 | Ht 67.0 in | Wt 264.0 lb

## 2023-09-30 DIAGNOSIS — N200 Calculus of kidney: Secondary | ICD-10-CM | POA: Diagnosis not present

## 2023-09-30 NOTE — Progress Notes (Signed)
   09/30/2023 3:44 PM   Lebron LOISE Finder May 16, 1980 [id number]  Reason for visit: Follow up nephrolithiasis  HPI: 43 year old female with 2 prior episodes of calcium oxalate stones.  She denies any flank pain, gross hematuria or urinary symptoms.  No stone events in the last year.  She is off topiramate.  24-hour urine 2023 notable for low urine volume of 2 L, elevated urine calcium of 333, elevated urine sodium of 237, low urine citrate of 395, urine pH 6.45.  We focused primarily on decrease salt in the diet to decrease the calcium level.  I personally viewed and interpreted the KUB today that shows no evidence of stones.  KUB last year showed a 3 mm left midpole stone.  We discussed general stone prevention strategies including adequate hydration with goal of producing 2.5 L of urine daily, increasing citric acid intake, increasing calcium intake during high oxalate meals, minimizing animal protein, and decreasing salt intake. Information about dietary recommendations given today.  I encouraged her to focus on increasing citrate in the diet through citrate rich beverages, as well as decrease salt intake which will improve the high urine calcium.    She prefers follow-up as needed, return precautions discussed   Redell JAYSON Burnet, MD   [facility] [address], [address] [place], [place] [address] [phone]

## 2023-09-30 NOTE — Patient Instructions (Signed)
 SABRA

## 2024-01-28 ENCOUNTER — Ambulatory Visit: Admitting: Obstetrics and Gynecology

## 2024-02-21 NOTE — Progress Notes (Unsigned)
 PCP:  Fernande Ophelia JINNY DOUGLAS, MD   No chief complaint on file.   HPI:      [name] is a 43 y.o. No obstetric history on file. who LMP was No LMP recorded. (Menstrual status: Irregular Periods)., presents today for her annual examination.  Her menses are regular every 28-30 days, lasting 4 days, mod flow on OCPs. Has had BTB for several months, light spotting for several days, no late/missed pills.  Dysmenorrhea mild to mod, improved with NSAIDs. Started OCPs 2 yrs ago for menorrhagia with neg labs/GYN u/s 11/20 with sx improvement, wants to continue. Had IUD in past with cycle control.   Sex activity: single partner, contraception - vasectomy.  Last Pap: 01/31/19  Results were: no abnormalities /neg HPV DNA   Last mammogram: never There is a FH of breast cancer in her MGM. There is a FH of ovarian cancer in her MGM and mat aunt. Mat aunt is BRCA neg and her daughter/cousin is MyRisk neg. Pt is MyRisk neg 2017; IBIS=12%. The patient does not do self-breast exams.  Tobacco use: The patient denies current or previous tobacco use. Alcohol use: couple drinks wkly No drug use.  Exercise: not active  She does get adequate calcium and Vitamin D in her diet. Labs with PCP.  Seeing psych for anxiety/depression with Rx mgmt.   Past Medical History:  Diagnosis Date   BRCA negative    MyRisk neg 2017   Family history of breast cancer 07/2015   IBIS=12%   Family history of ovarian cancer    Fam is BRCA/BART neg. Pt is NEG BRCA1 and BRCA 2 from June 2012, cousin of affected aunt is myrisk NEG   Kidney stone    Mild depression    Vaccine for human papilloma virus (HPV) types 6, 11, 16, and 18 administered     Past Surgical History:  Procedure Laterality Date   NO PAST SURGERIES      Family History  Problem Relation Age of Onset   Ovarian cancer Maternal Aunt 36       BRCA/BART neg   Breast cancer Maternal Grandmother 40       both breasts   Ovarian cancer Maternal  Grandmother    Hypertension Mother    Stroke Father    Heart failure Father    Hypertension Father    Diabetes Father    Heart failure Maternal Grandfather    Heart failure Paternal Grandfather     Social History   Socioeconomic History   Marital status: Married    Spouse name: Not on file   Number of children: Not on file   Years of education: Not on file   Highest education level: Not on file  Occupational History   Not on file  Tobacco Use   Smoking status: Never    Passive exposure: Never   Smokeless tobacco: Never  Vaping Use   Vaping status: Never Used  Substance and Sexual Activity   Alcohol use: Yes   Drug use: Never   Sexual activity: Yes    Birth control/protection: Surgical, Pill    Comment: Vasectomy  Other Topics Concern   Not on file  Social History Narrative   Not on file   Social Drivers of Health   Financial Resource Strain: Low Risk  (03/04/2023)   Received from [facility]   Overall Financial Resource Strain (CARDIA)    Difficulty of Paying Living Expenses: Not hard at all  Food Insecurity: No Food Insecurity (03/04/2023)   Received from [facility]   Hunger Vital Sign    Within the past 12 months, you worried that your food would run out before you got the money to buy more.: Never true    Within the past 12 months, the food you bought just didn't last and you didn't have money to get more.: Never true  Transportation Needs: No Transportation Needs (03/04/2023)   Received [facility] - Transportation    In the past 12 months, has lack of transportation kept you from medical appointments or from getting medications?: No    Lack of Transportation (Non-Medical): No  Physical Activity: Not on file  Stress: Not on file  Social Connections: Not on file  Intimate Partner Violence: Not on file     Current Outpatient Medications:    amphetamine-dextroamphetamine (ADDERALL XR)  25 MG 24 hr capsule, Take 25 mg by mouth every morning., Disp: , Rfl:    buPROPion (WELLBUTRIN XL) 150 MG 24 hr tablet, Take 150 mg by mouth every morning., Disp: , Rfl:    buPROPion (WELLBUTRIN XL) 300 MG 24 hr tablet, Take 300 mg by mouth every morning., Disp: , Rfl:    Cholecalciferol 50 MCG (2000 UT) CAPS, Take 2,000 Units by mouth daily., Disp: , Rfl:    Cyanocobalamin (VITAMIN B-12 PO), Take 1 tablet by mouth daily., Disp: , Rfl:    hydrOXYzine (ATARAX/VISTARIL) 25 MG tablet, Take 25-50 mg by mouth at bedtime., Disp: , Rfl:    methylphenidate (METADATE CD) 20 MG CR capsule, Take 20 mg by mouth every morning., Disp: , Rfl:    naproxen  sodium (ALEVE ) 220 MG tablet, Take 220 mg by mouth in the morning., Disp: , Rfl:    VIIBRYD 20 MG TABS, Take 20 mg by mouth daily., Disp: , Rfl:      ROS:  Review of Systems  Constitutional:  Negative for fatigue, fever and unexpected weight change.  Respiratory:  Negative for cough, shortness of breath and wheezing.   Cardiovascular:  Negative for chest pain, palpitations and leg swelling.  Gastrointestinal:  Negative for blood in stool, constipation, diarrhea, nausea and vomiting.  Endocrine: Negative for cold intolerance, heat intolerance and polyuria.  Genitourinary:  Positive for vaginal bleeding. Negative for dyspareunia, dysuria, flank pain, frequency, genital sores, hematuria, menstrual problem, pelvic pain, urgency, vaginal discharge and vaginal pain.  Musculoskeletal:  Positive for arthralgias. Negative for back pain, joint swelling and myalgias.  Skin:  Negative for rash.  Neurological:  Negative for dizziness, syncope, light-headedness, numbness and headaches.  Hematological:  Negative for adenopathy.  Psychiatric/Behavioral:  Negative for agitation, confusion, dysphoric mood, sleep disturbance and suicidal ideas. The patient is not nervous/anxious.    BREAST: No symptoms   Objective: There were no vitals taken for this  visit.   Physical Exam Constitutional:      Appearance: She is well-developed.  Genitourinary:     Vulva normal.     Right Labia: No rash, tenderness or lesions.    Left Labia: No tenderness, lesions or rash.    No vaginal discharge, erythema or tenderness.      Right Adnexa: not tender and no mass present.    Left Adnexa: not tender and no mass present.    No cervical friability or polyp.     Uterus is not enlarged or tender.  Breasts:    Right: No mass, nipple discharge, skin change or tenderness.  Left: No mass, nipple discharge, skin change or tenderness.  Neck:     Thyroid: No thyromegaly.  Cardiovascular:     Rate and Rhythm: Normal rate and regular rhythm.     Heart sounds: Normal heart sounds. No murmur heard. Pulmonary:     Effort: Pulmonary effort is normal.     Breath sounds: Normal breath sounds.  Abdominal:     Palpations: Abdomen is soft.     Tenderness: There is no abdominal tenderness. There is no guarding or rebound.  Musculoskeletal:        General: Normal range of motion.     Cervical back: Normal range of motion.  Lymphadenopathy:     Cervical: No cervical adenopathy.  Neurological:     General: No focal deficit present.     Mental Status: She is alert and oriented to person, place, and time.     Cranial Nerves: No cranial nerve deficit.  Skin:    General: Skin is warm and dry.  Psychiatric:        Mood and Affect: Mood normal.        Behavior: Behavior normal.        Thought Content: Thought content normal.        Judgment: Judgment normal.  Vitals reviewed.     Assessment/Plan: Encounter for annual routine gynecological examination  Encounter for surveillance of contraceptive pills - Plan: drospirenone -ethinyl estradiol  (YAZ) 3-0.02 MG tablet; OCP Rx eRxd. Change to yaz. Improved menorrhagia on OCPs.   Breakthrough bleeding on OCPs - Plan: drospirenone -ethinyl estradiol  (YAZ) 3-0.02 MG tablet; neg GYN u/s 11/20. Change OCPs to yaz. Rx  eRxd. F/u prn. Will eval further if sx persist.   Family history of breast cancer--pt is MyRisk neg; no increased screening recommendations. Start mammos age 27.   No orders of the defined types were placed in this encounter.             GYN counsel adequate intake of calcium and vitamin D, diet and exercise     F/U  No follow-ups on file.  Miciah Covelli B. Jovannie Ulibarri, PA-C 02/21/2024 8:31 PM

## 2024-02-22 ENCOUNTER — Encounter: Payer: Self-pay | Admitting: Obstetrics and Gynecology

## 2024-02-22 ENCOUNTER — Other Ambulatory Visit (HOSPITAL_COMMUNITY)
Admission: RE | Admit: 2024-02-22 | Discharge: 2024-02-22 | Disposition: A | Discharge status: Home / Self Care | Source: Ambulatory Visit | Attending: Obstetrics and Gynecology | Admitting: Obstetrics and Gynecology

## 2024-02-22 ENCOUNTER — Ambulatory Visit: Admitting: Obstetrics and Gynecology

## 2024-02-22 VITALS — BP 112/75 | HR 84 | Ht 67.0 in | Wt 254.0 lb

## 2024-02-22 DIAGNOSIS — Z01419 Encounter for gynecological examination (general) (routine) without abnormal findings: Secondary | ICD-10-CM

## 2024-02-22 DIAGNOSIS — Z803 Family history of malignant neoplasm of breast: Secondary | ICD-10-CM | POA: Diagnosis not present

## 2024-02-22 DIAGNOSIS — Z1151 Encounter for screening for human papillomavirus (HPV): Secondary | ICD-10-CM

## 2024-02-22 DIAGNOSIS — Z124 Encounter for screening for malignant neoplasm of cervix: Secondary | ICD-10-CM

## 2024-02-22 DIAGNOSIS — N939 Abnormal uterine and vaginal bleeding, unspecified: Secondary | ICD-10-CM

## 2024-02-22 DIAGNOSIS — Z1231 Encounter for screening mammogram for malignant neoplasm of breast: Secondary | ICD-10-CM

## 2024-02-22 NOTE — Patient Instructions (Addendum)
 I value your feedback and you entrusting [place] with your care. If you get a [facility] patient survey, I would appreciate you taking the time to let us know about your experience today. Thank you!  [facility] ([place])--[phone]

## 2024-02-28 LAB — CYTOLOGY - PAP
Comment: NEGATIVE
Diagnosis: NEGATIVE
High risk HPV: NEGATIVE

## 2024-03-30 ENCOUNTER — Ambulatory Visit
Admission: RE | Admit: 2024-03-30 | Discharge: 2024-03-30 | Disposition: A | Discharge status: Home / Self Care | Source: Ambulatory Visit | Attending: Obstetrics and Gynecology | Admitting: Obstetrics and Gynecology

## 2024-03-30 DIAGNOSIS — Z1231 Encounter for screening mammogram for malignant neoplasm of breast: Secondary | ICD-10-CM | POA: Diagnosis present

## 2024-04-04 ENCOUNTER — Ambulatory Visit: Payer: Self-pay | Admitting: Obstetrics and Gynecology
